# Patient Record
Sex: Female | Born: 1960 | Race: Black or African American | Hispanic: No | Smoking: Never smoker
Health system: Southern US, Community
[De-identification: ages and names within clinical notes are randomized; demographics above are authoritative.]

## PROBLEM LIST (undated history)

## (undated) DIAGNOSIS — I10 Essential (primary) hypertension: Secondary | ICD-10-CM

## (undated) DIAGNOSIS — E119 Type 2 diabetes mellitus without complications: Secondary | ICD-10-CM

---

## 1999-05-31 ENCOUNTER — Ambulatory Visit (HOSPITAL_COMMUNITY): Admission: RE | Admit: 1999-05-31 | Discharge: 1999-05-31 | Payer: Self-pay | Admitting: Family Medicine

## 1999-05-31 ENCOUNTER — Encounter: Payer: Self-pay | Admitting: Family Medicine

## 1999-07-26 ENCOUNTER — Other Ambulatory Visit: Admission: RE | Admit: 1999-07-26 | Discharge: 1999-07-26 | Payer: Self-pay | Admitting: Obstetrics and Gynecology

## 2000-02-24 ENCOUNTER — Encounter: Admission: RE | Admit: 2000-02-24 | Discharge: 2000-02-24 | Payer: Self-pay | Admitting: Obstetrics and Gynecology

## 2000-02-24 ENCOUNTER — Encounter: Payer: Self-pay | Admitting: Obstetrics and Gynecology

## 2001-03-22 ENCOUNTER — Encounter: Payer: Self-pay | Admitting: Obstetrics and Gynecology

## 2001-03-22 ENCOUNTER — Encounter: Admission: RE | Admit: 2001-03-22 | Discharge: 2001-03-22 | Payer: Self-pay | Admitting: Physical Therapy

## 2002-09-07 ENCOUNTER — Other Ambulatory Visit: Admission: RE | Admit: 2002-09-07 | Discharge: 2002-09-07 | Payer: Self-pay | Admitting: Obstetrics and Gynecology

## 2003-06-06 ENCOUNTER — Emergency Department (HOSPITAL_COMMUNITY): Admission: EM | Admit: 2003-06-06 | Discharge: 2003-06-06 | Payer: Self-pay | Admitting: Emergency Medicine

## 2003-09-29 ENCOUNTER — Other Ambulatory Visit: Admission: RE | Admit: 2003-09-29 | Discharge: 2003-09-29 | Payer: Self-pay | Admitting: Obstetrics and Gynecology

## 2004-03-19 ENCOUNTER — Emergency Department (HOSPITAL_COMMUNITY): Admission: EM | Admit: 2004-03-19 | Discharge: 2004-03-19 | Payer: Self-pay | Admitting: Family Medicine

## 2004-07-30 ENCOUNTER — Encounter: Admission: RE | Admit: 2004-07-30 | Discharge: 2004-09-17 | Payer: Self-pay | Admitting: Family Medicine

## 2004-11-08 ENCOUNTER — Encounter: Admission: RE | Admit: 2004-11-08 | Discharge: 2005-02-06 | Payer: Self-pay | Admitting: Family Medicine

## 2005-01-16 ENCOUNTER — Other Ambulatory Visit: Admission: RE | Admit: 2005-01-16 | Discharge: 2005-01-16 | Payer: Self-pay | Admitting: Obstetrics and Gynecology

## 2009-10-22 ENCOUNTER — Encounter: Admission: RE | Admit: 2009-10-22 | Discharge: 2009-10-22 | Payer: Self-pay | Admitting: Obstetrics and Gynecology

## 2016-02-26 DIAGNOSIS — D649 Anemia, unspecified: Secondary | ICD-10-CM | POA: Diagnosis not present

## 2016-02-26 DIAGNOSIS — I10 Essential (primary) hypertension: Secondary | ICD-10-CM | POA: Diagnosis not present

## 2016-02-26 DIAGNOSIS — E1100 Type 2 diabetes mellitus with hyperosmolarity without nonketotic hyperglycemic-hyperosmolar coma (NKHHC): Secondary | ICD-10-CM | POA: Diagnosis not present

## 2016-02-26 DIAGNOSIS — L630 Alopecia (capitis) totalis: Secondary | ICD-10-CM | POA: Diagnosis not present

## 2016-02-26 DIAGNOSIS — D644 Congenital dyserythropoietic anemia: Secondary | ICD-10-CM | POA: Diagnosis not present

## 2017-01-20 DIAGNOSIS — S43401A Unspecified sprain of right shoulder joint, initial encounter: Secondary | ICD-10-CM | POA: Diagnosis not present

## 2017-01-22 DIAGNOSIS — I10 Essential (primary) hypertension: Secondary | ICD-10-CM | POA: Diagnosis not present

## 2017-01-22 DIAGNOSIS — E1122 Type 2 diabetes mellitus with diabetic chronic kidney disease: Secondary | ICD-10-CM | POA: Diagnosis not present

## 2017-01-22 DIAGNOSIS — L630 Alopecia (capitis) totalis: Secondary | ICD-10-CM | POA: Diagnosis not present

## 2017-02-13 DIAGNOSIS — M24811 Other specific joint derangements of right shoulder, not elsewhere classified: Secondary | ICD-10-CM | POA: Diagnosis not present

## 2017-02-13 DIAGNOSIS — M542 Cervicalgia: Secondary | ICD-10-CM | POA: Diagnosis not present

## 2017-02-28 DIAGNOSIS — E1122 Type 2 diabetes mellitus with diabetic chronic kidney disease: Secondary | ICD-10-CM | POA: Diagnosis not present

## 2017-02-28 DIAGNOSIS — I10 Essential (primary) hypertension: Secondary | ICD-10-CM | POA: Diagnosis not present

## 2017-02-28 DIAGNOSIS — L630 Alopecia (capitis) totalis: Secondary | ICD-10-CM | POA: Diagnosis not present

## 2017-03-06 ENCOUNTER — Encounter: Payer: Self-pay | Admitting: Emergency Medicine

## 2017-03-06 ENCOUNTER — Emergency Department (HOSPITAL_COMMUNITY)
Admission: EM | Admit: 2017-03-06 | Discharge: 2017-03-06 | Disposition: A | Discharge status: Home / Self Care | Payer: BLUE CROSS/BLUE SHIELD | Attending: Emergency Medicine | Admitting: Emergency Medicine

## 2017-03-06 ENCOUNTER — Emergency Department (HOSPITAL_COMMUNITY): Payer: BLUE CROSS/BLUE SHIELD

## 2017-03-06 DIAGNOSIS — M25512 Pain in left shoulder: Secondary | ICD-10-CM | POA: Diagnosis not present

## 2017-03-06 DIAGNOSIS — Y939 Activity, unspecified: Secondary | ICD-10-CM | POA: Insufficient documentation

## 2017-03-06 DIAGNOSIS — Z79899 Other long term (current) drug therapy: Secondary | ICD-10-CM | POA: Insufficient documentation

## 2017-03-06 DIAGNOSIS — E119 Type 2 diabetes mellitus without complications: Secondary | ICD-10-CM | POA: Diagnosis not present

## 2017-03-06 DIAGNOSIS — S199XXA Unspecified injury of neck, initial encounter: Secondary | ICD-10-CM | POA: Diagnosis present

## 2017-03-06 DIAGNOSIS — M546 Pain in thoracic spine: Secondary | ICD-10-CM | POA: Diagnosis not present

## 2017-03-06 DIAGNOSIS — S40012A Contusion of left shoulder, initial encounter: Secondary | ICD-10-CM | POA: Insufficient documentation

## 2017-03-06 DIAGNOSIS — M545 Low back pain: Secondary | ICD-10-CM | POA: Diagnosis not present

## 2017-03-06 DIAGNOSIS — M542 Cervicalgia: Secondary | ICD-10-CM | POA: Diagnosis not present

## 2017-03-06 DIAGNOSIS — Y999 Unspecified external cause status: Secondary | ICD-10-CM | POA: Insufficient documentation

## 2017-03-06 DIAGNOSIS — S161XXA Strain of muscle, fascia and tendon at neck level, initial encounter: Secondary | ICD-10-CM | POA: Insufficient documentation

## 2017-03-06 DIAGNOSIS — S299XXA Unspecified injury of thorax, initial encounter: Secondary | ICD-10-CM | POA: Diagnosis not present

## 2017-03-06 DIAGNOSIS — S29012A Strain of muscle and tendon of back wall of thorax, initial encounter: Secondary | ICD-10-CM | POA: Diagnosis not present

## 2017-03-06 DIAGNOSIS — M25552 Pain in left hip: Secondary | ICD-10-CM | POA: Diagnosis not present

## 2017-03-06 DIAGNOSIS — S39012A Strain of muscle, fascia and tendon of lower back, initial encounter: Secondary | ICD-10-CM

## 2017-03-06 DIAGNOSIS — S4992XA Unspecified injury of left shoulder and upper arm, initial encounter: Secondary | ICD-10-CM | POA: Diagnosis not present

## 2017-03-06 DIAGNOSIS — T148XXA Other injury of unspecified body region, initial encounter: Secondary | ICD-10-CM | POA: Diagnosis not present

## 2017-03-06 DIAGNOSIS — S79912A Unspecified injury of left hip, initial encounter: Secondary | ICD-10-CM | POA: Diagnosis not present

## 2017-03-06 DIAGNOSIS — I10 Essential (primary) hypertension: Secondary | ICD-10-CM | POA: Insufficient documentation

## 2017-03-06 DIAGNOSIS — Z794 Long term (current) use of insulin: Secondary | ICD-10-CM | POA: Diagnosis not present

## 2017-03-06 DIAGNOSIS — R079 Chest pain, unspecified: Secondary | ICD-10-CM | POA: Diagnosis not present

## 2017-03-06 DIAGNOSIS — Y92410 Unspecified street and highway as the place of occurrence of the external cause: Secondary | ICD-10-CM | POA: Insufficient documentation

## 2017-03-06 HISTORY — DX: Essential (primary) hypertension: I10

## 2017-03-06 HISTORY — DX: Type 2 diabetes mellitus without complications: E11.9

## 2017-03-06 MED ORDER — ONDANSETRON 8 MG PO TBDP
8.0000 mg | ORAL_TABLET | Freq: Once | ORAL | Status: AC
  Administered 2017-03-06: 8 mg via ORAL
  Filled 2017-03-06: qty 1

## 2017-03-06 MED ORDER — OXYCODONE-ACETAMINOPHEN 5-325 MG PO TABS
2.0000 | ORAL_TABLET | Freq: Once | ORAL | Status: AC
  Administered 2017-03-06: 2 via ORAL
  Filled 2017-03-06: qty 2

## 2017-03-06 NOTE — ED Notes (Signed)
Pt at X-ray

## 2017-03-06 NOTE — ED Notes (Signed)
Pt reports nausea.  Dr. Rosalia Hammersay made aware.  zofran ordered.
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
## 2017-03-09 DIAGNOSIS — M545 Low back pain: Secondary | ICD-10-CM | POA: Diagnosis not present

## 2017-03-09 DIAGNOSIS — G44319 Acute post-traumatic headache, not intractable: Secondary | ICD-10-CM | POA: Diagnosis not present

## 2017-03-09 DIAGNOSIS — M542 Cervicalgia: Secondary | ICD-10-CM | POA: Diagnosis not present

## 2017-03-18 DIAGNOSIS — M2560 Stiffness of unspecified joint, not elsewhere classified: Secondary | ICD-10-CM | POA: Diagnosis not present

## 2017-03-18 DIAGNOSIS — M545 Low back pain: Secondary | ICD-10-CM | POA: Diagnosis not present

## 2017-03-18 DIAGNOSIS — M62830 Muscle spasm of back: Secondary | ICD-10-CM | POA: Diagnosis not present

## 2017-03-18 DIAGNOSIS — M25512 Pain in left shoulder: Secondary | ICD-10-CM | POA: Diagnosis not present

## 2017-03-23 DIAGNOSIS — M545 Low back pain: Secondary | ICD-10-CM | POA: Diagnosis not present

## 2017-03-23 DIAGNOSIS — M542 Cervicalgia: Secondary | ICD-10-CM | POA: Diagnosis not present

## 2017-03-23 DIAGNOSIS — M62830 Muscle spasm of back: Secondary | ICD-10-CM | POA: Diagnosis not present

## 2017-03-23 DIAGNOSIS — M25512 Pain in left shoulder: Secondary | ICD-10-CM | POA: Diagnosis not present

## 2017-03-23 DIAGNOSIS — M2560 Stiffness of unspecified joint, not elsewhere classified: Secondary | ICD-10-CM | POA: Diagnosis not present

## 2017-03-25 DIAGNOSIS — M545 Low back pain: Secondary | ICD-10-CM | POA: Diagnosis not present

## 2017-03-25 DIAGNOSIS — M2560 Stiffness of unspecified joint, not elsewhere classified: Secondary | ICD-10-CM | POA: Diagnosis not present

## 2017-03-25 DIAGNOSIS — M25512 Pain in left shoulder: Secondary | ICD-10-CM | POA: Diagnosis not present

## 2017-03-25 DIAGNOSIS — M62830 Muscle spasm of back: Secondary | ICD-10-CM | POA: Diagnosis not present

## 2017-03-30 DIAGNOSIS — M2560 Stiffness of unspecified joint, not elsewhere classified: Secondary | ICD-10-CM | POA: Diagnosis not present

## 2017-03-30 DIAGNOSIS — M545 Low back pain: Secondary | ICD-10-CM | POA: Diagnosis not present

## 2017-03-30 DIAGNOSIS — M62830 Muscle spasm of back: Secondary | ICD-10-CM | POA: Diagnosis not present

## 2017-03-30 DIAGNOSIS — M25512 Pain in left shoulder: Secondary | ICD-10-CM | POA: Diagnosis not present

## 2017-04-01 DIAGNOSIS — M62830 Muscle spasm of back: Secondary | ICD-10-CM | POA: Diagnosis not present

## 2017-04-01 DIAGNOSIS — M25512 Pain in left shoulder: Secondary | ICD-10-CM | POA: Diagnosis not present

## 2017-04-01 DIAGNOSIS — M545 Low back pain: Secondary | ICD-10-CM | POA: Diagnosis not present

## 2017-04-01 DIAGNOSIS — M2560 Stiffness of unspecified joint, not elsewhere classified: Secondary | ICD-10-CM | POA: Diagnosis not present

## 2017-04-14 DIAGNOSIS — Z1231 Encounter for screening mammogram for malignant neoplasm of breast: Secondary | ICD-10-CM | POA: Diagnosis not present

## 2017-04-14 DIAGNOSIS — Z6828 Body mass index (BMI) 28.0-28.9, adult: Secondary | ICD-10-CM | POA: Diagnosis not present

## 2017-04-14 DIAGNOSIS — Z01419 Encounter for gynecological examination (general) (routine) without abnormal findings: Secondary | ICD-10-CM | POA: Diagnosis not present

## 2017-04-15 DIAGNOSIS — M2560 Stiffness of unspecified joint, not elsewhere classified: Secondary | ICD-10-CM | POA: Diagnosis not present

## 2017-04-15 DIAGNOSIS — M62830 Muscle spasm of back: Secondary | ICD-10-CM | POA: Diagnosis not present

## 2017-04-15 DIAGNOSIS — M25512 Pain in left shoulder: Secondary | ICD-10-CM | POA: Diagnosis not present

## 2017-04-15 DIAGNOSIS — M542 Cervicalgia: Secondary | ICD-10-CM | POA: Diagnosis not present

## 2017-04-15 DIAGNOSIS — M545 Low back pain: Secondary | ICD-10-CM | POA: Diagnosis not present

## 2017-04-17 DIAGNOSIS — M545 Low back pain: Secondary | ICD-10-CM | POA: Diagnosis not present

## 2017-04-17 DIAGNOSIS — M25512 Pain in left shoulder: Secondary | ICD-10-CM | POA: Diagnosis not present

## 2017-04-17 DIAGNOSIS — M2560 Stiffness of unspecified joint, not elsewhere classified: Secondary | ICD-10-CM | POA: Diagnosis not present

## 2017-04-17 DIAGNOSIS — M62830 Muscle spasm of back: Secondary | ICD-10-CM | POA: Diagnosis not present

## 2017-04-21 DIAGNOSIS — D0512 Intraductal carcinoma in situ of left breast: Secondary | ICD-10-CM | POA: Diagnosis not present

## 2017-04-27 DIAGNOSIS — Z170 Estrogen receptor positive status [ER+]: Secondary | ICD-10-CM | POA: Diagnosis not present

## 2017-04-27 DIAGNOSIS — M545 Low back pain: Secondary | ICD-10-CM | POA: Diagnosis not present

## 2017-04-27 DIAGNOSIS — C50912 Malignant neoplasm of unspecified site of left female breast: Secondary | ICD-10-CM | POA: Diagnosis not present

## 2017-04-27 DIAGNOSIS — M25512 Pain in left shoulder: Secondary | ICD-10-CM | POA: Diagnosis not present

## 2017-04-27 DIAGNOSIS — M62830 Muscle spasm of back: Secondary | ICD-10-CM | POA: Diagnosis not present

## 2017-04-27 DIAGNOSIS — M2560 Stiffness of unspecified joint, not elsewhere classified: Secondary | ICD-10-CM | POA: Diagnosis not present

## 2017-04-29 DIAGNOSIS — M25512 Pain in left shoulder: Secondary | ICD-10-CM | POA: Diagnosis not present

## 2017-04-29 DIAGNOSIS — M545 Low back pain: Secondary | ICD-10-CM | POA: Diagnosis not present

## 2017-04-29 DIAGNOSIS — M62830 Muscle spasm of back: Secondary | ICD-10-CM | POA: Diagnosis not present

## 2017-04-29 DIAGNOSIS — M2560 Stiffness of unspecified joint, not elsewhere classified: Secondary | ICD-10-CM | POA: Diagnosis not present

## 2017-04-30 DIAGNOSIS — C50912 Malignant neoplasm of unspecified site of left female breast: Secondary | ICD-10-CM | POA: Diagnosis not present

## 2017-04-30 DIAGNOSIS — R42 Dizziness and giddiness: Secondary | ICD-10-CM | POA: Diagnosis not present

## 2017-05-04 DIAGNOSIS — M545 Low back pain: Secondary | ICD-10-CM | POA: Diagnosis not present

## 2017-05-04 DIAGNOSIS — M2560 Stiffness of unspecified joint, not elsewhere classified: Secondary | ICD-10-CM | POA: Diagnosis not present

## 2017-05-04 DIAGNOSIS — M25512 Pain in left shoulder: Secondary | ICD-10-CM | POA: Diagnosis not present

## 2017-05-04 DIAGNOSIS — M62830 Muscle spasm of back: Secondary | ICD-10-CM | POA: Diagnosis not present

## 2017-05-06 DIAGNOSIS — Z170 Estrogen receptor positive status [ER+]: Secondary | ICD-10-CM | POA: Diagnosis not present

## 2017-05-06 DIAGNOSIS — Z803 Family history of malignant neoplasm of breast: Secondary | ICD-10-CM | POA: Diagnosis not present

## 2017-05-06 DIAGNOSIS — M2560 Stiffness of unspecified joint, not elsewhere classified: Secondary | ICD-10-CM | POA: Diagnosis not present

## 2017-05-06 DIAGNOSIS — M545 Low back pain: Secondary | ICD-10-CM | POA: Diagnosis not present

## 2017-05-06 DIAGNOSIS — C50912 Malignant neoplasm of unspecified site of left female breast: Secondary | ICD-10-CM | POA: Diagnosis not present

## 2017-05-06 DIAGNOSIS — M25512 Pain in left shoulder: Secondary | ICD-10-CM | POA: Diagnosis not present

## 2017-05-06 DIAGNOSIS — M62830 Muscle spasm of back: Secondary | ICD-10-CM | POA: Diagnosis not present

## 2017-05-06 DIAGNOSIS — D0512 Intraductal carcinoma in situ of left breast: Secondary | ICD-10-CM | POA: Diagnosis not present

## 2017-05-06 DIAGNOSIS — C50212 Malignant neoplasm of upper-inner quadrant of left female breast: Secondary | ICD-10-CM | POA: Diagnosis not present

## 2017-05-07 DIAGNOSIS — L630 Alopecia (capitis) totalis: Secondary | ICD-10-CM | POA: Diagnosis not present

## 2017-05-07 DIAGNOSIS — F4310 Post-traumatic stress disorder, unspecified: Secondary | ICD-10-CM | POA: Diagnosis not present

## 2017-05-07 DIAGNOSIS — I10 Essential (primary) hypertension: Secondary | ICD-10-CM | POA: Diagnosis not present

## 2017-05-07 DIAGNOSIS — E1122 Type 2 diabetes mellitus with diabetic chronic kidney disease: Secondary | ICD-10-CM | POA: Diagnosis not present

## 2017-05-11 DIAGNOSIS — M62830 Muscle spasm of back: Secondary | ICD-10-CM | POA: Diagnosis not present

## 2017-05-11 DIAGNOSIS — M2560 Stiffness of unspecified joint, not elsewhere classified: Secondary | ICD-10-CM | POA: Diagnosis not present

## 2017-05-11 DIAGNOSIS — M545 Low back pain: Secondary | ICD-10-CM | POA: Diagnosis not present

## 2017-05-11 DIAGNOSIS — M25512 Pain in left shoulder: Secondary | ICD-10-CM | POA: Diagnosis not present

## 2017-05-11 DIAGNOSIS — C50912 Malignant neoplasm of unspecified site of left female breast: Secondary | ICD-10-CM | POA: Diagnosis not present

## 2017-05-11 DIAGNOSIS — Z170 Estrogen receptor positive status [ER+]: Secondary | ICD-10-CM | POA: Diagnosis not present

## 2017-05-14 DIAGNOSIS — Z853 Personal history of malignant neoplasm of breast: Secondary | ICD-10-CM | POA: Diagnosis not present

## 2017-05-14 DIAGNOSIS — Z9882 Breast implant status: Secondary | ICD-10-CM | POA: Diagnosis not present

## 2017-05-18 DIAGNOSIS — M545 Low back pain: Secondary | ICD-10-CM | POA: Diagnosis not present

## 2017-05-18 DIAGNOSIS — M2560 Stiffness of unspecified joint, not elsewhere classified: Secondary | ICD-10-CM | POA: Diagnosis not present

## 2017-05-18 DIAGNOSIS — M25512 Pain in left shoulder: Secondary | ICD-10-CM | POA: Diagnosis not present

## 2017-05-18 DIAGNOSIS — M62830 Muscle spasm of back: Secondary | ICD-10-CM | POA: Diagnosis not present

## 2017-06-02 DIAGNOSIS — C50819 Malignant neoplasm of overlapping sites of unspecified female breast: Secondary | ICD-10-CM | POA: Diagnosis not present

## 2017-06-02 DIAGNOSIS — Z170 Estrogen receptor positive status [ER+]: Secondary | ICD-10-CM | POA: Diagnosis not present

## 2017-06-12 DIAGNOSIS — E1122 Type 2 diabetes mellitus with diabetic chronic kidney disease: Secondary | ICD-10-CM | POA: Diagnosis not present

## 2017-06-12 DIAGNOSIS — I10 Essential (primary) hypertension: Secondary | ICD-10-CM | POA: Diagnosis not present

## 2017-06-12 DIAGNOSIS — L630 Alopecia (capitis) totalis: Secondary | ICD-10-CM | POA: Diagnosis not present

## 2017-07-09 DIAGNOSIS — R2240 Localized swelling, mass and lump, unspecified lower limb: Secondary | ICD-10-CM | POA: Diagnosis not present

## 2017-07-29 ENCOUNTER — Ambulatory Visit (INDEPENDENT_AMBULATORY_CARE_PROVIDER_SITE_OTHER): Payer: BLUE CROSS/BLUE SHIELD | Admitting: Surgery

## 2017-07-29 ENCOUNTER — Encounter (INDEPENDENT_AMBULATORY_CARE_PROVIDER_SITE_OTHER): Payer: Self-pay | Admitting: Surgery

## 2017-07-29 DIAGNOSIS — M5442 Lumbago with sciatica, left side: Secondary | ICD-10-CM

## 2017-07-29 DIAGNOSIS — M542 Cervicalgia: Secondary | ICD-10-CM | POA: Diagnosis not present

## 2017-07-29 DIAGNOSIS — M5441 Lumbago with sciatica, right side: Secondary | ICD-10-CM
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
## 2017-07-30 DIAGNOSIS — I10 Essential (primary) hypertension: Secondary | ICD-10-CM | POA: Diagnosis not present

## 2017-07-30 DIAGNOSIS — E118 Type 2 diabetes mellitus with unspecified complications: Secondary | ICD-10-CM | POA: Diagnosis not present

## 2017-08-05 ENCOUNTER — Emergency Department (HOSPITAL_COMMUNITY): Payer: BLUE CROSS/BLUE SHIELD

## 2017-08-05 ENCOUNTER — Emergency Department (HOSPITAL_COMMUNITY)
Admission: EM | Admit: 2017-08-05 | Discharge: 2017-08-05 | Disposition: A | Discharge status: Home / Self Care | Payer: BLUE CROSS/BLUE SHIELD | Attending: Emergency Medicine | Admitting: Emergency Medicine

## 2017-08-05 ENCOUNTER — Encounter (HOSPITAL_COMMUNITY): Payer: Self-pay | Admitting: Nurse Practitioner

## 2017-08-05 DIAGNOSIS — R197 Diarrhea, unspecified: Secondary | ICD-10-CM

## 2017-08-05 DIAGNOSIS — R404 Transient alteration of awareness: Secondary | ICD-10-CM | POA: Diagnosis not present

## 2017-08-05 DIAGNOSIS — I10 Essential (primary) hypertension: Secondary | ICD-10-CM | POA: Diagnosis not present

## 2017-08-05 DIAGNOSIS — E119 Type 2 diabetes mellitus without complications: Secondary | ICD-10-CM | POA: Diagnosis not present

## 2017-08-05 DIAGNOSIS — R918 Other nonspecific abnormal finding of lung field: Secondary | ICD-10-CM | POA: Diagnosis not present

## 2017-08-05 DIAGNOSIS — Z794 Long term (current) use of insulin: Secondary | ICD-10-CM | POA: Insufficient documentation

## 2017-08-05 DIAGNOSIS — Z79899 Other long term (current) drug therapy: Secondary | ICD-10-CM | POA: Insufficient documentation

## 2017-08-05 DIAGNOSIS — R55 Syncope and collapse: Secondary | ICD-10-CM | POA: Insufficient documentation

## 2017-08-05 DIAGNOSIS — N3000 Acute cystitis without hematuria: Secondary | ICD-10-CM | POA: Insufficient documentation

## 2017-08-05 LAB — I-STAT CHEM 8, ED
BUN: 26 mg/dL — ABNORMAL HIGH (ref 6–20)
Calcium, Ion: 1.24 mmol/L (ref 1.15–1.40)
Chloride: 101 mmol/L (ref 101–111)
Creatinine, Ser: 1 mg/dL (ref 0.44–1.00)
Glucose, Bld: 193 mg/dL — ABNORMAL HIGH (ref 65–99)
HCT: 43 % (ref 36.0–46.0)
Hemoglobin: 14.6 g/dL (ref 12.0–15.0)
Potassium: 4.1 mmol/L (ref 3.5–5.1)
Sodium: 136 mmol/L (ref 135–145)
TCO2: 26 mmol/L (ref 22–32)

## 2017-08-05 LAB — URINALYSIS, ROUTINE W REFLEX MICROSCOPIC
Bilirubin Urine: NEGATIVE
Glucose, UA: >=500 mg/dL — AB
Ketones, ur: NEGATIVE mg/dL
Nitrite: POSITIVE — AB
Protein, ur: NEGATIVE mg/dL
Specific Gravity, Urine: 1.024 (ref 1.005–1.030)
pH: 5 (ref 5.0–8.0)

## 2017-08-05 LAB — CBC WITH DIFFERENTIAL/PLATELET
Basophils Absolute: 0 10*3/uL (ref 0.0–0.1)
Basophils Relative: 0 %
Eosinophils Absolute: 0.1 10*3/uL (ref 0.0–0.7)
Eosinophils Relative: 2 %
HCT: 41.3 % (ref 36.0–46.0)
Hemoglobin: 14.1 g/dL (ref 12.0–15.0)
Lymphocytes Relative: 29 %
Lymphs Abs: 1.5 10*3/uL (ref 0.7–4.0)
MCH: 26.8 pg (ref 26.0–34.0)
MCHC: 34.1 g/dL (ref 30.0–36.0)
MCV: 78.5 fL (ref 78.0–100.0)
Monocytes Absolute: 0.4 10*3/uL (ref 0.1–1.0)
Monocytes Relative: 7 %
Neutro Abs: 3.3 10*3/uL (ref 1.7–7.7)
Neutrophils Relative %: 62 %
Platelets: 241 10*3/uL (ref 150–400)
RBC: 5.26 MIL/uL — ABNORMAL HIGH (ref 3.87–5.11)
RDW: 13.5 % (ref 11.5–15.5)
WBC: 5.3 10*3/uL (ref 4.0–10.5)

## 2017-08-05 LAB — CBG MONITORING, ED: Glucose-Capillary: 202 mg/dL — ABNORMAL HIGH (ref 65–99)

## 2017-08-05 LAB — I-STAT TROPONIN, ED: Troponin i, poc: 0 ng/mL (ref 0.00–0.08)

## 2017-08-05 MED ORDER — CEPHALEXIN 500 MG PO CAPS: 500.0000 mg | ORAL_CAPSULE | Freq: Four times a day (QID) | ORAL | 0 refills | Status: DC

## 2017-08-05 MED ORDER — DEXTROSE 5 % IV SOLN
1.0000 g | Freq: Once | INTRAVENOUS | Status: AC
  Administered 2017-08-05: 1 g via INTRAVENOUS
  Filled 2017-08-05: qty 10

## 2017-08-05 MED ORDER — SODIUM CHLORIDE 0.9 % IV BOLUS (SEPSIS)
500.0000 mL | Freq: Once | INTRAVENOUS | Status: AC
  Administered 2017-08-05: 500 mL via INTRAVENOUS

## 2017-08-05 NOTE — ED Notes (Signed)
Unable to collect labs at this time patient is using the restroom. 

## 2017-08-05 NOTE — ED Triage Notes (Signed)
Pt is brought in by EMS, reportedly while at work she had an episode of diaphoresis, visual disturbance (she saw spots) and passed out. She has known PMHx of DM and her spot cbg check by EMS was 192. Had a recent HTN medication change where her amlodipine was decreased by half. Denies chest pain or shortness of breath at this time.

## 2017-08-05 NOTE — ED Provider Notes (Signed)
[facility]-EMERGENCY DEPT Provider Note   CSN: [id number] Arrival date & time: 08/05/17  0207     History   Chief Complaint Chief Complaint  Patient presents with  . Passed Out    HPI Whitney Mitchell is a 56 y.o. female.  The history is provided by the patient.  Loss of Consciousness   This is a new problem. The current episode started less than 1 hour ago. The problem occurs rarely. The problem has been resolved. She lost consciousness for a period of less than one minute. Associated with: feeling overheated and diarrhea. Associated symptoms include light-headedness and nausea. Pertinent negatives include abdominal pain, back pain, bladder incontinence, bowel incontinence, chest pain, clumsiness, confusion, congestion, diaphoresis, dizziness, fever, focal sensory loss, focal weakness, headaches, malaise/fatigue, palpitations, seizures, slurred speech, vertigo, visual change, vomiting and weakness. She has tried nothing for the symptoms. The treatment provided significant relief. Her past medical history is significant for DM. Her past medical history does not include CVA.    Past Medical History:  Diagnosis Date  . Diabetes mellitus without complication (HCC)   . Hypertension     There are no active problems to display for this patient.   History reviewed. No pertinent surgical history.  OB History    No data available       Home Medications    Prior to Admission medications   Medication Sig Start Date End Date Taking? Authorizing Provider  empagliflozin (JARDIANCE) 25 MG TABS tablet Take 25 mg by mouth daily.    [provider]  Insulin Glargine (TOUJEO SOLOSTAR [place]) Inject 5 Units into the skin.    [provider]  Ketotifen Fumarate (ALLERGY EYE DROPS OP) Place 1 drop into both eyes daily as needed (allergy).    [provider]  Multiple Vitamin (MULTIVITAMIN WITH MINERALS) TABS tablet Take 1 tablet by mouth daily.     [provider]  nystatin-triamcinolone (MYCOLOG II) cream Apply 1 application topically 2 (two) times a week. Applies 2 times a week as needed for ichiness 12/19/16   [provider]  Semaglutide (OZEMPIC) 0.25 or 0.5 MG/DOSE SOPN Inject 0.25 Units into the skin once a week.    [provider]    Family History History reviewed. No pertinent family history.  Social History Social History  Substance Use Topics  . Smoking status: Never Smoker  . Smokeless tobacco: Never Used  . Alcohol use No     Allergies   Patient has no known allergies.   Review of Systems Review of Systems  Constitutional: Negative for diaphoresis, fever and malaise/fatigue.  HENT: Negative for congestion.   Respiratory: Negative for shortness of breath.   Cardiovascular: Positive for syncope. Negative for chest pain, palpitations and leg swelling.  Gastrointestinal: Positive for nausea. Negative for abdominal pain, bowel incontinence and vomiting.  Genitourinary: Negative for bladder incontinence.  Musculoskeletal: Negative for back pain.  Neurological: Positive for light-headedness. Negative for dizziness, vertigo, tremors, focal weakness, seizures, speech difficulty, weakness, numbness and headaches.  Psychiatric/Behavioral: Negative for confusion.  All other systems reviewed and are negative.    Physical Exam Updated Vital Signs BP 128/86 (BP Location: Right Arm)   Pulse 77   Temp 98 F (36.7 C) (Oral)   Resp 15   SpO2 100%   Physical Exam  Constitutional: She is oriented to person, place, and time. She appears well-developed and well-nourished. No distress.  HENT:  Head: Normocephalic and atraumatic.  Mouth/Throat: No oropharyngeal exudate.  Eyes: Pupils are equal, round, and reactive to light. Conjunctivae are normal.  Neck: Normal range of motion. Neck supple. No JVD present.  Cardiovascular: Normal rate, regular rhythm, normal heart sounds and intact distal  pulses.   Pulmonary/Chest: Effort normal and breath sounds normal. No stridor. No respiratory distress. She has no wheezes. She has no rales.  Abdominal: Soft. Bowel sounds are normal. She exhibits no mass. There is no tenderness. There is no rebound and no guarding.  Musculoskeletal: Normal range of motion.  Lymphadenopathy:    She has no cervical adenopathy.  Neurological: She is alert and oriented to person, place, and time. No cranial nerve deficit.  Skin: Skin is warm and dry. Capillary refill takes less than 2 seconds.  Psychiatric: She has a normal mood and affect.     ED Treatments / Results   Vitals:   08/05/17 0216  BP: 128/86  Pulse: 77  Resp: 15  Temp: 98 F (36.7 C)  SpO2: 100%    Labs (all labs ordered are listed, but only abnormal results are displayed)  Results for orders placed or performed during the hospital encounter of 08/05/17  CBC with Differential/Platelet  Result Value Ref Range   WBC 5.3 4.0 - 10.5 K/uL   RBC 5.26 (H) 3.87 - 5.11 MIL/uL   Hemoglobin 14.1 12.0 - 15.0 g/dL   HCT 13.0 86.5 - 78.4 %   MCV 78.5 78.0 - 100.0 fL   MCH 26.8 26.0 - 34.0 pg   MCHC 34.1 30.0 - 36.0 g/dL   RDW 69.6 29.5 - 28.4 %   Platelets 241 150 - 400 K/uL   Neutrophils Relative % 62 %   Neutro Abs 3.3 1.7 - 7.7 K/uL   Lymphocytes Relative 29 %   Lymphs Abs 1.5 0.7 - 4.0 K/uL   Monocytes Relative 7 %   Monocytes Absolute 0.4 0.1 - 1.0 K/uL   Eosinophils Relative 2 %   Eosinophils Absolute 0.1 0.0 - 0.7 K/uL   Basophils Relative 0 %   Basophils Absolute 0.0 0.0 - 0.1 K/uL  Urinalysis, Routine w reflex microscopic  Result Value Ref Range   Color, Urine YELLOW YELLOW   APPearance CLOUDY (A) CLEAR   Specific Gravity, Urine 1.024 1.005 - 1.030   pH 5.0 5.0 - 8.0   Glucose, UA >=500 (A) NEGATIVE mg/dL   Hgb urine dipstick SMALL (A) NEGATIVE   Bilirubin Urine NEGATIVE NEGATIVE   Ketones, ur NEGATIVE NEGATIVE mg/dL   Protein, ur NEGATIVE NEGATIVE mg/dL   Nitrite  POSITIVE (A) NEGATIVE   Leukocytes, UA LARGE (A) NEGATIVE   RBC / HPF 6-30 0 - 5 RBC/hpf   WBC, UA TOO NUMEROUS TO COUNT 0 - 5 WBC/hpf   Bacteria, UA MANY (A) NONE SEEN   Squamous Epithelial / LPF 6-30 (A) NONE SEEN   Mucus PRESENT   CBG monitoring, ED  Result Value Ref Range   Glucose-Capillary 202 (H) 65 - 99 mg/dL   Comment 1 Notify RN    Comment 2 Document in Chart   I-stat chem 8, ed  Result Value Ref Range   Sodium 136 135 - 145 mmol/L   Potassium 4.1 3.5 - 5.1 mmol/L   Chloride 101 101 - 111 mmol/L   BUN 26 (H) 6 - 20 mg/dL   Creatinine, Ser 1.32 0.44 - 1.00 mg/dL   Glucose, Bld 440 (H) 65 - 99 mg/dL   Calcium, Ion 1.02 7.25 - 1.40 mmol/L   TCO2 26 22 - 32  mmol/L   Hemoglobin 14.6 12.0 - 15.0 g/dL   HCT 16.143.0 09.636.0 - 04.546.0 %  I-stat troponin, ED  Result Value Ref Range   Troponin i, poc 0.00 0.00 - 0.08 ng/mL   Comment 3           No results found.   EKG Interpretation  Date/Time:  Wednesday August 05 2017 02:11:16 EDT Ventricular Rate:  80 PR Interval:    QRS Duration: 86 QT Interval:  378 QTC Calculation: 436 R Axis:   76 Text Interpretation:  Sinus rhythm Confirmed by Nicanor AlconPalumbo, Haylee Mcanany ([id number]) on 08/05/2017 4:02:24 AM       Procedures Procedures (including critical care time)  Medications Ordered in ED Medications  sodium chloride 0.9 % bolus 500 mL (not administered)  cefTRIAXone (ROCEPHIN) 1 g in dextrose 5 % 50 mL IVPB (not administered)      Final Clinical Impressions(s) / ED Diagnoses  UTi and diarrhea:  Not orthostatic no ectopy on the monitor.  Follow up with your PMD.  All questions answered to the patient's satisfaction.    Strict return precautions given for  Shortness of breath, swelling or the lips or tongue, chest pain, dyspnea on exertion, new weakness or numbness changes in vision or speech,  Inability to tolerate liquids or food, changes in voice cough, altered mental status or any concerns. No signs of systemic illness or infection. The  patient is nontoxic-appearing on exam and vital signs are within normal limits.    I have reviewed the triage vital signs and the nursing notes. Pertinent labs &imaging results that were available during my care of the patient were reviewed by me and considered in my medical decision making (see chart for details).  After history, exam, and medical workup I feel the patient has been appropriately medically screened and is safe for discharge home. Pertinent diagnoses were discussed with the patient. Patient was given return precautions.     Kaid Seeberger, MD 08/05/17 [id number]

## 2017-08-05 NOTE — ED Notes (Signed)
Bed: [id number] Expected date:  Expected time:  Means of arrival:  Comments: 56yoF  Syncopal episode

## 2017-08-25 ENCOUNTER — Encounter (INDEPENDENT_AMBULATORY_CARE_PROVIDER_SITE_OTHER): Payer: Self-pay

## 2017-08-25 ENCOUNTER — Ambulatory Visit (INDEPENDENT_AMBULATORY_CARE_PROVIDER_SITE_OTHER): Payer: BLUE CROSS/BLUE SHIELD | Admitting: Orthopaedic Surgery

## 2017-09-01 ENCOUNTER — Other Ambulatory Visit (HOSPITAL_COMMUNITY)
Admission: RE | Admit: 2017-09-01 | Discharge: 2017-09-01 | Disposition: A | Discharge status: Home / Self Care | Payer: BLUE CROSS/BLUE SHIELD | Source: Ambulatory Visit | Attending: Family Medicine | Admitting: Family Medicine

## 2017-09-01 ENCOUNTER — Other Ambulatory Visit: Payer: Self-pay | Admitting: Family Medicine

## 2017-09-01 DIAGNOSIS — Z113 Encounter for screening for infections with a predominantly sexual mode of transmission: Secondary | ICD-10-CM | POA: Diagnosis not present

## 2017-09-01 DIAGNOSIS — H9193 Unspecified hearing loss, bilateral: Secondary | ICD-10-CM | POA: Diagnosis not present

## 2017-09-01 DIAGNOSIS — E1100 Type 2 diabetes mellitus with hyperosmolarity without nonketotic hyperglycemic-hyperosmolar coma (NKHHC): Secondary | ICD-10-CM | POA: Diagnosis not present

## 2017-09-01 DIAGNOSIS — N760 Acute vaginitis: Secondary | ICD-10-CM | POA: Insufficient documentation

## 2017-09-01 DIAGNOSIS — N390 Urinary tract infection, site not specified: Secondary | ICD-10-CM | POA: Diagnosis not present

## 2017-09-04 LAB — URINE CYTOLOGY ANCILLARY ONLY
Bacterial vaginitis: POSITIVE — AB
Candida vaginitis: NEGATIVE
Chlamydia: NEGATIVE
Neisseria Gonorrhea: NEGATIVE
Trichomonas: NEGATIVE

## 2017-10-20 HISTORY — PX: BRAIN SURGERY: SHX531

## 2017-11-11 DIAGNOSIS — R197 Diarrhea, unspecified: Secondary | ICD-10-CM | POA: Diagnosis not present

## 2017-11-11 DIAGNOSIS — I10 Essential (primary) hypertension: Secondary | ICD-10-CM | POA: Diagnosis not present

## 2017-11-11 DIAGNOSIS — J399 Disease of upper respiratory tract, unspecified: Secondary | ICD-10-CM | POA: Diagnosis not present

## 2017-11-11 DIAGNOSIS — E1122 Type 2 diabetes mellitus with diabetic chronic kidney disease: Secondary | ICD-10-CM | POA: Diagnosis not present

## 2017-11-23 DIAGNOSIS — R55 Syncope and collapse: Secondary | ICD-10-CM | POA: Diagnosis not present

## 2017-11-23 DIAGNOSIS — R531 Weakness: Secondary | ICD-10-CM | POA: Diagnosis not present

## 2017-11-23 DIAGNOSIS — S0990XA Unspecified injury of head, initial encounter: Secondary | ICD-10-CM | POA: Diagnosis not present

## 2017-11-23 DIAGNOSIS — N3000 Acute cystitis without hematuria: Secondary | ICD-10-CM | POA: Diagnosis not present

## 2017-11-23 DIAGNOSIS — M542 Cervicalgia: Secondary | ICD-10-CM | POA: Diagnosis not present

## 2017-11-23 DIAGNOSIS — R9431 Abnormal electrocardiogram [ECG] [EKG]: Secondary | ICD-10-CM | POA: Diagnosis not present

## 2017-11-23 DIAGNOSIS — R42 Dizziness and giddiness: Secondary | ICD-10-CM | POA: Diagnosis not present

## 2017-11-23 DIAGNOSIS — R202 Paresthesia of skin: Secondary | ICD-10-CM | POA: Diagnosis not present

## 2017-11-23 DIAGNOSIS — Z5181 Encounter for therapeutic drug level monitoring: Secondary | ICD-10-CM | POA: Diagnosis not present

## 2017-11-23 DIAGNOSIS — D352 Benign neoplasm of pituitary gland: Secondary | ICD-10-CM | POA: Diagnosis not present

## 2017-11-23 DIAGNOSIS — R51 Headache: Secondary | ICD-10-CM | POA: Diagnosis not present

## 2017-11-23 DIAGNOSIS — R339 Retention of urine, unspecified: Secondary | ICD-10-CM | POA: Diagnosis not present

## 2017-11-23 DIAGNOSIS — S3993XA Unspecified injury of pelvis, initial encounter: Secondary | ICD-10-CM | POA: Diagnosis not present

## 2017-11-23 DIAGNOSIS — N390 Urinary tract infection, site not specified: Secondary | ICD-10-CM | POA: Diagnosis not present

## 2017-11-23 DIAGNOSIS — R2681 Unsteadiness on feet: Secondary | ICD-10-CM | POA: Diagnosis not present

## 2017-11-23 DIAGNOSIS — W19XXXA Unspecified fall, initial encounter: Secondary | ICD-10-CM | POA: Diagnosis not present

## 2017-11-23 DIAGNOSIS — Y33XXXA Other specified events, undetermined intent, initial encounter: Secondary | ICD-10-CM | POA: Diagnosis not present

## 2017-11-23 DIAGNOSIS — E222 Syndrome of inappropriate secretion of antidiuretic hormone: Secondary | ICD-10-CM | POA: Diagnosis not present

## 2017-11-24 DIAGNOSIS — N390 Urinary tract infection, site not specified: Secondary | ICD-10-CM | POA: Diagnosis not present

## 2017-11-24 DIAGNOSIS — R55 Syncope and collapse: Secondary | ICD-10-CM | POA: Diagnosis not present

## 2017-11-25 DIAGNOSIS — D352 Benign neoplasm of pituitary gland: Secondary | ICD-10-CM | POA: Diagnosis not present

## 2017-12-05 DIAGNOSIS — E1122 Type 2 diabetes mellitus with diabetic chronic kidney disease: Secondary | ICD-10-CM | POA: Diagnosis not present

## 2017-12-09 DIAGNOSIS — R55 Syncope and collapse: Secondary | ICD-10-CM | POA: Diagnosis not present

## 2017-12-16 DIAGNOSIS — D352 Benign neoplasm of pituitary gland: Secondary | ICD-10-CM | POA: Diagnosis not present

## 2017-12-16 DIAGNOSIS — H3581 Retinal edema: Secondary | ICD-10-CM | POA: Diagnosis not present

## 2017-12-16 DIAGNOSIS — E871 Hypo-osmolality and hyponatremia: Secondary | ICD-10-CM | POA: Diagnosis not present

## 2017-12-29 DIAGNOSIS — J310 Chronic rhinitis: Secondary | ICD-10-CM | POA: Diagnosis not present

## 2017-12-29 DIAGNOSIS — R2681 Unsteadiness on feet: Secondary | ICD-10-CM | POA: Diagnosis not present

## 2017-12-29 DIAGNOSIS — E1165 Type 2 diabetes mellitus with hyperglycemia: Secondary | ICD-10-CM | POA: Diagnosis not present

## 2017-12-29 DIAGNOSIS — E118 Type 2 diabetes mellitus with unspecified complications: Secondary | ICD-10-CM | POA: Diagnosis not present

## 2017-12-29 DIAGNOSIS — I10 Essential (primary) hypertension: Secondary | ICD-10-CM | POA: Diagnosis not present

## 2017-12-29 DIAGNOSIS — D352 Benign neoplasm of pituitary gland: Secondary | ICD-10-CM | POA: Diagnosis not present

## 2018-01-04 DIAGNOSIS — E118 Type 2 diabetes mellitus with unspecified complications: Secondary | ICD-10-CM | POA: Diagnosis not present

## 2018-01-04 DIAGNOSIS — Z87440 Personal history of urinary (tract) infections: Secondary | ICD-10-CM | POA: Diagnosis not present

## 2018-01-04 DIAGNOSIS — D497 Neoplasm of unspecified behavior of endocrine glands and other parts of nervous system: Secondary | ICD-10-CM | POA: Diagnosis not present

## 2018-01-04 DIAGNOSIS — I10 Essential (primary) hypertension: Secondary | ICD-10-CM | POA: Diagnosis not present

## 2018-01-04 DIAGNOSIS — D352 Benign neoplasm of pituitary gland: Secondary | ICD-10-CM | POA: Diagnosis not present

## 2018-01-12 DIAGNOSIS — D352 Benign neoplasm of pituitary gland: Secondary | ICD-10-CM | POA: Diagnosis not present

## 2018-01-12 DIAGNOSIS — J3489 Other specified disorders of nose and nasal sinuses: Secondary | ICD-10-CM | POA: Diagnosis not present
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
## 2018-01-14 DIAGNOSIS — E1122 Type 2 diabetes mellitus with diabetic chronic kidney disease: Secondary | ICD-10-CM | POA: Diagnosis not present

## 2018-01-14 DIAGNOSIS — I10 Essential (primary) hypertension: Secondary | ICD-10-CM | POA: Diagnosis not present

## 2018-02-09 DIAGNOSIS — J323 Chronic sphenoidal sinusitis: Secondary | ICD-10-CM | POA: Diagnosis not present

## 2018-02-09 DIAGNOSIS — J3489 Other specified disorders of nose and nasal sinuses: Secondary | ICD-10-CM | POA: Diagnosis not present

## 2018-02-09 DIAGNOSIS — D352 Benign neoplasm of pituitary gland: Secondary | ICD-10-CM | POA: Diagnosis not present

## 2018-02-17 DIAGNOSIS — E1122 Type 2 diabetes mellitus with diabetic chronic kidney disease: Secondary | ICD-10-CM | POA: Diagnosis not present

## 2018-02-17 DIAGNOSIS — I10 Essential (primary) hypertension: Secondary | ICD-10-CM | POA: Diagnosis not present

## 2018-03-10 DIAGNOSIS — D352 Benign neoplasm of pituitary gland: Secondary | ICD-10-CM | POA: Diagnosis not present

## 2018-04-13 DIAGNOSIS — J3489 Other specified disorders of nose and nasal sinuses: Secondary | ICD-10-CM | POA: Diagnosis not present

## 2018-05-06 DIAGNOSIS — E1122 Type 2 diabetes mellitus with diabetic chronic kidney disease: Secondary | ICD-10-CM | POA: Diagnosis not present

## 2018-05-06 DIAGNOSIS — I10 Essential (primary) hypertension: Secondary | ICD-10-CM | POA: Diagnosis not present

## 2018-05-06 DIAGNOSIS — D352 Benign neoplasm of pituitary gland: Secondary | ICD-10-CM | POA: Diagnosis not present

## 2018-05-06 DIAGNOSIS — R5383 Other fatigue: Secondary | ICD-10-CM | POA: Diagnosis not present

## 2018-08-18 DIAGNOSIS — D352 Benign neoplasm of pituitary gland: Secondary | ICD-10-CM | POA: Diagnosis not present

## 2018-08-18 DIAGNOSIS — I10 Essential (primary) hypertension: Secondary | ICD-10-CM | POA: Diagnosis not present

## 2018-08-18 DIAGNOSIS — L630 Alopecia (capitis) totalis: Secondary | ICD-10-CM | POA: Diagnosis not present

## 2018-08-18 DIAGNOSIS — E118 Type 2 diabetes mellitus with unspecified complications: Secondary | ICD-10-CM | POA: Diagnosis not present

## 2018-08-31 DIAGNOSIS — R51 Headache: Secondary | ICD-10-CM | POA: Diagnosis not present

## 2018-08-31 DIAGNOSIS — D352 Benign neoplasm of pituitary gland: Secondary | ICD-10-CM | POA: Diagnosis not present

## 2018-08-31 DIAGNOSIS — J3489 Other specified disorders of nose and nasal sinuses: Secondary | ICD-10-CM | POA: Diagnosis not present

## 2018-08-31 DIAGNOSIS — G8929 Other chronic pain: Secondary | ICD-10-CM | POA: Diagnosis not present

## 2018-09-22 DIAGNOSIS — Z6827 Body mass index (BMI) 27.0-27.9, adult: Secondary | ICD-10-CM | POA: Diagnosis not present

## 2018-09-22 DIAGNOSIS — R110 Nausea: Secondary | ICD-10-CM | POA: Diagnosis not present

## 2018-09-22 DIAGNOSIS — R630 Anorexia: Secondary | ICD-10-CM | POA: Diagnosis not present

## 2018-09-22 DIAGNOSIS — Z79899 Other long term (current) drug therapy: Secondary | ICD-10-CM | POA: Diagnosis not present

## 2018-09-22 DIAGNOSIS — D352 Benign neoplasm of pituitary gland: Secondary | ICD-10-CM | POA: Diagnosis not present

## 2018-10-26 DIAGNOSIS — Z6828 Body mass index (BMI) 28.0-28.9, adult: Secondary | ICD-10-CM | POA: Diagnosis not present

## 2018-10-26 DIAGNOSIS — Z1231 Encounter for screening mammogram for malignant neoplasm of breast: Secondary | ICD-10-CM | POA: Diagnosis not present

## 2018-10-26 DIAGNOSIS — Z01419 Encounter for gynecological examination (general) (routine) without abnormal findings: Secondary | ICD-10-CM | POA: Diagnosis not present

## 2018-12-28 DIAGNOSIS — I10 Essential (primary) hypertension: Secondary | ICD-10-CM | POA: Diagnosis not present

## 2018-12-28 DIAGNOSIS — D352 Benign neoplasm of pituitary gland: Secondary | ICD-10-CM | POA: Diagnosis not present

## 2018-12-28 DIAGNOSIS — E1169 Type 2 diabetes mellitus with other specified complication: Secondary | ICD-10-CM | POA: Diagnosis not present

## 2018-12-28 DIAGNOSIS — Z6829 Body mass index (BMI) 29.0-29.9, adult: Secondary | ICD-10-CM | POA: Diagnosis not present

## 2019-01-18 DIAGNOSIS — H1045 Other chronic allergic conjunctivitis: Secondary | ICD-10-CM | POA: Diagnosis not present

## 2019-01-24 DIAGNOSIS — Z111 Encounter for screening for respiratory tuberculosis: Secondary | ICD-10-CM | POA: Diagnosis not present

## 2019-03-30 DIAGNOSIS — D352 Benign neoplasm of pituitary gland: Secondary | ICD-10-CM | POA: Diagnosis not present

## 2019-03-30 DIAGNOSIS — E119 Type 2 diabetes mellitus without complications: Secondary | ICD-10-CM | POA: Diagnosis not present

## 2019-03-30 DIAGNOSIS — D443 Neoplasm of uncertain behavior of pituitary gland: Secondary | ICD-10-CM | POA: Diagnosis not present

## 2019-06-27 DIAGNOSIS — Z20828 Contact with and (suspected) exposure to other viral communicable diseases: Secondary | ICD-10-CM | POA: Diagnosis not present

## 2019-07-01 DIAGNOSIS — L630 Alopecia (capitis) totalis: Secondary | ICD-10-CM | POA: Diagnosis not present

## 2019-07-01 DIAGNOSIS — D532 Scorbutic anemia: Secondary | ICD-10-CM | POA: Diagnosis not present

## 2019-07-01 DIAGNOSIS — I10 Essential (primary) hypertension: Secondary | ICD-10-CM | POA: Diagnosis not present

## 2019-07-01 DIAGNOSIS — D352 Benign neoplasm of pituitary gland: Secondary | ICD-10-CM | POA: Diagnosis not present

## 2019-07-01 DIAGNOSIS — E1169 Type 2 diabetes mellitus with other specified complication: Secondary | ICD-10-CM | POA: Diagnosis not present

## 2019-09-30 DIAGNOSIS — E1169 Type 2 diabetes mellitus with other specified complication: Secondary | ICD-10-CM | POA: Diagnosis not present

## 2019-09-30 DIAGNOSIS — I10 Essential (primary) hypertension: Secondary | ICD-10-CM | POA: Diagnosis not present

## 2019-12-20 DIAGNOSIS — E7800 Pure hypercholesterolemia, unspecified: Secondary | ICD-10-CM | POA: Diagnosis not present

## 2019-12-20 DIAGNOSIS — E1169 Type 2 diabetes mellitus with other specified complication: Secondary | ICD-10-CM | POA: Diagnosis not present

## 2019-12-20 DIAGNOSIS — I10 Essential (primary) hypertension: Secondary | ICD-10-CM | POA: Diagnosis not present

## 2019-12-20 DIAGNOSIS — E237 Disorder of pituitary gland, unspecified: Secondary | ICD-10-CM | POA: Diagnosis not present

## 2019-12-20 DIAGNOSIS — E785 Hyperlipidemia, unspecified: Secondary | ICD-10-CM | POA: Diagnosis not present

## 2019-12-24 DIAGNOSIS — E1169 Type 2 diabetes mellitus with other specified complication: Secondary | ICD-10-CM | POA: Diagnosis not present

## 2019-12-24 DIAGNOSIS — I10 Essential (primary) hypertension: Secondary | ICD-10-CM | POA: Diagnosis not present

## 2020-01-25 DIAGNOSIS — Z111 Encounter for screening for respiratory tuberculosis: Secondary | ICD-10-CM | POA: Diagnosis not present

## 2020-03-09 DIAGNOSIS — E785 Hyperlipidemia, unspecified: Secondary | ICD-10-CM | POA: Diagnosis not present

## 2020-03-09 DIAGNOSIS — I10 Essential (primary) hypertension: Secondary | ICD-10-CM | POA: Diagnosis not present

## 2020-03-09 DIAGNOSIS — E7800 Pure hypercholesterolemia, unspecified: Secondary | ICD-10-CM | POA: Diagnosis not present

## 2020-03-09 DIAGNOSIS — E1169 Type 2 diabetes mellitus with other specified complication: Secondary | ICD-10-CM | POA: Diagnosis not present

## 2020-03-26 DIAGNOSIS — L639 Alopecia areata, unspecified: Secondary | ICD-10-CM | POA: Diagnosis not present

## 2020-03-26 DIAGNOSIS — L309 Dermatitis, unspecified: Secondary | ICD-10-CM | POA: Diagnosis not present

## 2020-04-04 DIAGNOSIS — D352 Benign neoplasm of pituitary gland: Secondary | ICD-10-CM | POA: Diagnosis not present

## 2020-04-10 ENCOUNTER — Other Ambulatory Visit: Payer: Self-pay

## 2020-04-10 ENCOUNTER — Emergency Department (HOSPITAL_COMMUNITY): Payer: BC Managed Care – PPO

## 2020-04-10 ENCOUNTER — Encounter (HOSPITAL_COMMUNITY): Payer: Self-pay

## 2020-04-10 ENCOUNTER — Emergency Department (HOSPITAL_COMMUNITY)
Admission: EM | Admit: 2020-04-10 | Discharge: 2020-04-10 | Disposition: A | Discharge status: Home / Self Care | Payer: BC Managed Care – PPO | Attending: Emergency Medicine | Admitting: Emergency Medicine

## 2020-04-10 DIAGNOSIS — E119 Type 2 diabetes mellitus without complications: Secondary | ICD-10-CM | POA: Diagnosis not present

## 2020-04-10 DIAGNOSIS — R197 Diarrhea, unspecified: Secondary | ICD-10-CM | POA: Diagnosis not present

## 2020-04-10 DIAGNOSIS — R339 Retention of urine, unspecified: Secondary | ICD-10-CM | POA: Diagnosis not present

## 2020-04-10 DIAGNOSIS — R1110 Vomiting, unspecified: Secondary | ICD-10-CM | POA: Diagnosis not present

## 2020-04-10 DIAGNOSIS — I10 Essential (primary) hypertension: Secondary | ICD-10-CM | POA: Diagnosis not present

## 2020-04-10 DIAGNOSIS — R112 Nausea with vomiting, unspecified: Secondary | ICD-10-CM | POA: Diagnosis not present

## 2020-04-10 LAB — URINALYSIS, ROUTINE W REFLEX MICROSCOPIC
Bacteria, UA: NONE SEEN
Bilirubin Urine: NEGATIVE
Glucose, UA: >=500 mg/dL — AB
Hgb urine dipstick: NEGATIVE
Ketones, ur: 20 mg/dL — AB
Nitrite: NEGATIVE
Protein, ur: NEGATIVE mg/dL
Specific Gravity, Urine: 1.028 (ref 1.005–1.030)
pH: 5 (ref 5.0–8.0)

## 2020-04-10 LAB — HEPATIC FUNCTION PANEL
ALT: 23 U/L (ref 0–44)
AST: 23 U/L (ref 15–41)
Albumin: 4.6 g/dL (ref 3.5–5.0)
Alkaline Phosphatase: 65 U/L (ref 38–126)
Bilirubin, Direct: 0.3 mg/dL — ABNORMAL HIGH (ref 0.0–0.2)
Indirect Bilirubin: 2.1 mg/dL — ABNORMAL HIGH (ref 0.3–0.9)
Total Bilirubin: 2.4 mg/dL — ABNORMAL HIGH (ref 0.3–1.2)
Total Protein: 8.1 g/dL (ref 6.5–8.1)

## 2020-04-10 LAB — CBC
HCT: 44.8 % (ref 36.0–46.0)
Hemoglobin: 14.4 g/dL (ref 12.0–15.0)
MCH: 26.6 pg (ref 26.0–34.0)
MCHC: 32.1 g/dL (ref 30.0–36.0)
MCV: 82.7 fL (ref 80.0–100.0)
Platelets: 234 10*3/uL (ref 150–400)
RBC: 5.42 MIL/uL — ABNORMAL HIGH (ref 3.87–5.11)
RDW: 13.3 % (ref 11.5–15.5)
WBC: 5.5 10*3/uL (ref 4.0–10.5)
nRBC: 0 % (ref 0.0–0.2)

## 2020-04-10 LAB — BASIC METABOLIC PANEL
Anion gap: 14 (ref 5–15)
BUN: 17 mg/dL (ref 6–20)
CO2: 22 mmol/L (ref 22–32)
Calcium: 9.8 mg/dL (ref 8.9–10.3)
Chloride: 100 mmol/L (ref 98–111)
Creatinine, Ser: 0.81 mg/dL (ref 0.44–1.00)
GFR calc Af Amer: >60 mL/min (ref >60)
GFR calc non Af Amer: >60 mL/min (ref >60)
Glucose, Bld: 137 mg/dL — ABNORMAL HIGH (ref 70–99)
Potassium: 4.1 mmol/L (ref 3.5–5.1)
Sodium: 136 mmol/L (ref 135–145)

## 2020-04-10 LAB — CBG MONITORING, ED: Glucose-Capillary: 142 mg/dL — ABNORMAL HIGH (ref 70–99)

## 2020-04-10 LAB — LIPASE, BLOOD: Lipase: 23 U/L (ref 11–51)

## 2020-04-10 MED ORDER — SODIUM CHLORIDE (PF) 0.9 % IJ SOLN
INTRAMUSCULAR | Status: AC
  Filled 2020-04-10: qty 50

## 2020-04-10 MED ORDER — ONDANSETRON HCL 4 MG/2ML IJ SOLN
4.0000 mg | Freq: Once | INTRAMUSCULAR | Status: AC
  Administered 2020-04-10: 4 mg via INTRAVENOUS
  Filled 2020-04-10: qty 2

## 2020-04-10 MED ORDER — SODIUM CHLORIDE 0.9% FLUSH: 3.0000 mL | Freq: Once | INTRAVENOUS | Status: DC

## 2020-04-10 MED ORDER — ACETAMINOPHEN 325 MG PO TABS
650.0000 mg | ORAL_TABLET | Freq: Once | ORAL | Status: AC
  Administered 2020-04-10: 650 mg via ORAL
  Filled 2020-04-10: qty 2

## 2020-04-10 MED ORDER — IOHEXOL 300 MG/ML  SOLN
100.0000 mL | Freq: Once | INTRAMUSCULAR | Status: AC | PRN
  Administered 2020-04-10: 100 mL via INTRAVENOUS

## 2020-04-10 MED ORDER — ONDANSETRON 8 MG PO TBDP
8.0000 mg | ORAL_TABLET | Freq: Three times a day (TID) | ORAL | 0 refills | Status: DC | PRN
Start: 2020-04-10 — End: 2021-04-17

## 2020-04-10 NOTE — ED Provider Notes (Signed)
[facility] DEPT Provider Note   CSN: [id number] Arrival date & time: 04/10/20  0031     History Chief Complaint  Patient presents with  . Emesis    Whitney Mitchell is a 59 y.o. female.  HPI  59 yo female ho t2dm, pituitary tumor, hypertension complains of nausea and vomiting x 4 days,  No bile or blood, associated diarrhea began next day, associated headache, some chills but no definite fever although she has not taken it. Patient with some crampy left lower abdominal pain. No frequency or pain with urination, urine is dark.  Patient has not been keeping clear liquids down.  BS 100-140 without meds for 2 days. Patient had covid and was vaccinated in March moderna. She is followed at [facility] for pituitary tumor and is scheduled for now yearly mri June 27.  PMD Dr. Criss Rosales      Past Medical History:  Diagnosis Date  . Diabetes mellitus without complication ([place])   . Hypertension     There are no problems to display for this patient.   History reviewed. No pertinent surgical history.   OB History   No obstetric history on file.     No family history on file.  Social History   Tobacco Use  . Smoking status: Never Smoker  . Smokeless tobacco: Never Used  Vaping Use  . Vaping Use: Never used  Substance Use Topics  . Alcohol use: No  . Drug use: No    Home Medications Prior to Admission medications   Medication Sig Start Date End Date Taking? Authorizing Provider  amLODipine (NORVASC) 2.5 MG tablet Take 2.5 mg by mouth daily.    [provider]  BIOTIN PO Take 1 tablet by mouth daily.    [provider]  cephALEXin (KEFLEX) 500 MG capsule Take 1 capsule (500 mg total) by mouth 4 (four) times daily. 08/05/17   Palumbo, April, MD  empagliflozin (JARDIANCE) 25 MG TABS tablet Take 25 mg by mouth daily.    [provider]  IRBESARTAN PO Take by mouth.    [provider]  Multiple Vitamin (MULTIVITAMIN WITH  MINERALS) TABS tablet Take 1 tablet by mouth daily.    [provider]  Semaglutide (OZEMPIC) 0.25 or 0.5 MG/DOSE SOPN Inject 0.25 Units into the skin once a week.    [provider]  SPIRONOLACTONE PO Take 1 tablet by mouth daily.    [provider]    Allergies    Patient has no known allergies.  Review of Systems   Review of Systems  All other systems reviewed and are negative.   Physical Exam Updated Vital Signs BP 124/90 (BP Location: Right Arm)   Pulse 80   Temp 98.3 F (36.8 C) (Oral)   Resp 18   SpO2 99%   Physical Exam Vitals and nursing note reviewed.  Constitutional:      General: She is not in acute distress.    Appearance: Normal appearance. She is normal weight. She is not ill-appearing.  HENT:     Head: Normocephalic.     Right Ear: External ear normal.     Left Ear: External ear normal.     Mouth/Throat:     Mouth: Mucous membranes are moist.  Eyes:     Pupils: Pupils are equal, round, and reactive to light.  Cardiovascular:     Rate and Rhythm: Normal rate and regular rhythm.     Pulses: Normal pulses.  Heart sounds: Normal heart sounds.  Pulmonary:     Effort: Pulmonary effort is normal.     Breath sounds: Normal breath sounds.  Abdominal:     General: Abdomen is flat. Bowel sounds are normal.     Palpations: Abdomen is soft.     Comments: Mild ttp llq  Musculoskeletal:        General: Normal range of motion.  Skin:    General: Skin is warm and dry.     Capillary Refill: Capillary refill takes less than 2 seconds.  Neurological:     General: No focal deficit present.     Mental Status: She is alert and oriented to person, place, and time.     Cranial Nerves: No cranial nerve deficit.     Motor: No weakness.  Psychiatric:        Mood and Affect: Mood normal.     ED Results / Procedures / Treatments   Labs (all labs ordered are listed, but only abnormal results are displayed) Labs Reviewed  BASIC METABOLIC  PANEL - Abnormal; Notable for the following components:      Result Value   Glucose, Bld 137 (*)    All other components within normal limits  CBC - Abnormal; Notable for the following components:   RBC 5.42 (*)    All other components within normal limits  URINALYSIS, ROUTINE W REFLEX MICROSCOPIC - Abnormal; Notable for the following components:   Glucose, UA >=500 (*)    Ketones, ur 20 (*)    Leukocytes,Ua SMALL (*)    All other components within normal limits  CBG MONITORING, ED - Abnormal; Notable for the following components:   Glucose-Capillary 142 (*)    All other components within normal limits  LIPASE, BLOOD  HEPATIC FUNCTION PANEL  CBG MONITORING, ED    EKG EKG Interpretation  Date/Time:  Tuesday April 10 2020 01:38:31 EDT Ventricular Rate:  82 PR Interval:    QRS Duration: 90 QT Interval:  369 QTC Calculation: 431 R Axis:   16 Text Interpretation: Sinus rhythm Borderline low voltage, extremity leads When compared with ECG of 08/05/2017, No significant change was found Confirmed by Dione Booze ([id number]) on 04/10/2020 1:59:21 AM   Radiology No results found.  Procedures Procedures (including critical care time)  Medications Ordered in ED Medications  sodium chloride flush (NS) 0.9 % injection 3 mL (3 mLs Intravenous Not Given 04/10/20 3568)    ED Course  I have reviewed the triage vital signs and the nursing notes.  Pertinent labs & imaging results that were available during my care of the patient were reviewed by me and considered in my medical decision making (see chart for details).    MDM Rules/Calculators/A&P                         Labs normal Patient feels improved after fluids and antiemetics CT shows question of distended bladder.  Patient states that she does not feel that she needs to urinate at this time.  Will obtain post void residual Plan p.o. fluid challenge 1:44 PM Patient voided with post void residual of 500 cc Plan Foley catheter We  will culture urine Plan outpatient referral to urology Final Clinical Impression(s) / ED Diagnoses Final diagnoses:  Non-intractable vomiting with nausea, unspecified vomiting type  Urinary retention  Nausea vomiting and diarrhea    Rx / DC Orders ED Discharge Orders    None  Margarita Grizzle, MD 04/10/20 [phone]

## 2020-04-10 NOTE — ED Notes (Signed)
Post void residual greater than >500

## 2020-04-10 NOTE — Discharge Instructions (Signed)
Please call urology for recheck and catheter removal  Take zofran as needed for nausea Drink plenty of clear liquids

## 2020-04-10 NOTE — ED Triage Notes (Signed)
Patient arrived stating she has been constantly vomiting/diarrhea for the last few days. Also reports belching more than normal. Stating she is feeling weak and has no appetite.

## 2020-04-11 LAB — URINE CULTURE: Culture: <10000 — AB

## 2020-04-13 DIAGNOSIS — R3914 Feeling of incomplete bladder emptying: Secondary | ICD-10-CM | POA: Diagnosis not present

## 2020-04-13 DIAGNOSIS — B957 Other staphylococcus as the cause of diseases classified elsewhere: Secondary | ICD-10-CM | POA: Diagnosis not present

## 2020-04-13 DIAGNOSIS — R351 Nocturia: Secondary | ICD-10-CM | POA: Diagnosis not present

## 2020-04-13 DIAGNOSIS — N390 Urinary tract infection, site not specified: Secondary | ICD-10-CM | POA: Diagnosis not present

## 2020-04-13 DIAGNOSIS — R350 Frequency of micturition: Secondary | ICD-10-CM | POA: Diagnosis not present

## 2020-04-13 DIAGNOSIS — N3946 Mixed incontinence: Secondary | ICD-10-CM | POA: Diagnosis not present

## 2020-04-17 DIAGNOSIS — N3946 Mixed incontinence: Secondary | ICD-10-CM | POA: Diagnosis not present

## 2020-04-17 DIAGNOSIS — R3914 Feeling of incomplete bladder emptying: Secondary | ICD-10-CM | POA: Diagnosis not present

## 2020-06-03 DIAGNOSIS — G9389 Other specified disorders of brain: Secondary | ICD-10-CM | POA: Diagnosis not present

## 2020-06-03 DIAGNOSIS — D352 Benign neoplasm of pituitary gland: Secondary | ICD-10-CM | POA: Diagnosis not present

## 2020-06-20 DIAGNOSIS — D352 Benign neoplasm of pituitary gland: Secondary | ICD-10-CM | POA: Diagnosis not present

## 2020-07-09 DIAGNOSIS — Z01419 Encounter for gynecological examination (general) (routine) without abnormal findings: Secondary | ICD-10-CM | POA: Diagnosis not present

## 2020-07-09 DIAGNOSIS — Z6827 Body mass index (BMI) 27.0-27.9, adult: Secondary | ICD-10-CM | POA: Diagnosis not present

## 2020-07-09 DIAGNOSIS — Z1231 Encounter for screening mammogram for malignant neoplasm of breast: Secondary | ICD-10-CM | POA: Diagnosis not present

## 2020-09-19 DIAGNOSIS — E119 Type 2 diabetes mellitus without complications: Secondary | ICD-10-CM | POA: Diagnosis not present

## 2020-09-19 DIAGNOSIS — D443 Neoplasm of uncertain behavior of pituitary gland: Secondary | ICD-10-CM | POA: Diagnosis not present

## 2020-10-03 DIAGNOSIS — I10 Essential (primary) hypertension: Secondary | ICD-10-CM | POA: Diagnosis not present

## 2020-10-03 DIAGNOSIS — E1169 Type 2 diabetes mellitus with other specified complication: Secondary | ICD-10-CM | POA: Diagnosis not present

## 2020-11-06 DIAGNOSIS — Z86010 Personal history of colonic polyps: Secondary | ICD-10-CM | POA: Diagnosis not present

## 2020-11-06 DIAGNOSIS — K5904 Chronic idiopathic constipation: Secondary | ICD-10-CM | POA: Diagnosis not present

## 2020-11-06 DIAGNOSIS — Z1211 Encounter for screening for malignant neoplasm of colon: Secondary | ICD-10-CM | POA: Diagnosis not present

## 2020-11-06 DIAGNOSIS — R634 Abnormal weight loss: Secondary | ICD-10-CM | POA: Diagnosis not present

## 2020-12-10 DIAGNOSIS — K5730 Diverticulosis of large intestine without perforation or abscess without bleeding: Secondary | ICD-10-CM | POA: Diagnosis not present

## 2020-12-10 DIAGNOSIS — D3615 Benign neoplasm of peripheral nerves and autonomic nervous system of abdomen: Secondary | ICD-10-CM | POA: Diagnosis not present

## 2020-12-10 DIAGNOSIS — K635 Polyp of colon: Secondary | ICD-10-CM | POA: Diagnosis not present

## 2020-12-10 DIAGNOSIS — D125 Benign neoplasm of sigmoid colon: Secondary | ICD-10-CM | POA: Diagnosis not present

## 2020-12-10 DIAGNOSIS — Z1211 Encounter for screening for malignant neoplasm of colon: Secondary | ICD-10-CM | POA: Diagnosis not present

## 2021-02-21 ENCOUNTER — Other Ambulatory Visit: Payer: Self-pay | Admitting: Family Medicine

## 2021-02-21 ENCOUNTER — Ambulatory Visit
Admission: RE | Admit: 2021-02-21 | Discharge: 2021-02-21 | Disposition: A | Discharge status: Home / Self Care | Payer: BC Managed Care – PPO | Source: Ambulatory Visit | Attending: Family Medicine | Admitting: Family Medicine

## 2021-02-21 ENCOUNTER — Other Ambulatory Visit: Payer: Self-pay

## 2021-02-21 DIAGNOSIS — M25561 Pain in right knee: Secondary | ICD-10-CM

## 2021-02-21 DIAGNOSIS — M25571 Pain in right ankle and joints of right foot: Secondary | ICD-10-CM

## 2021-02-21 DIAGNOSIS — E1122 Type 2 diabetes mellitus with diabetic chronic kidney disease: Secondary | ICD-10-CM | POA: Diagnosis not present

## 2021-02-21 DIAGNOSIS — M25511 Pain in right shoulder: Secondary | ICD-10-CM

## 2021-02-21 DIAGNOSIS — I10 Essential (primary) hypertension: Secondary | ICD-10-CM | POA: Diagnosis not present

## 2021-02-21 DIAGNOSIS — M19011 Primary osteoarthritis, right shoulder: Secondary | ICD-10-CM | POA: Diagnosis not present

## 2021-02-21 DIAGNOSIS — E1169 Type 2 diabetes mellitus with other specified complication: Secondary | ICD-10-CM | POA: Diagnosis not present

## 2021-02-21 DIAGNOSIS — E785 Hyperlipidemia, unspecified: Secondary | ICD-10-CM | POA: Diagnosis not present

## 2021-02-26 DIAGNOSIS — I10 Essential (primary) hypertension: Secondary | ICD-10-CM | POA: Diagnosis not present

## 2021-02-26 DIAGNOSIS — E1169 Type 2 diabetes mellitus with other specified complication: Secondary | ICD-10-CM | POA: Diagnosis not present

## 2021-03-12 DIAGNOSIS — E1169 Type 2 diabetes mellitus with other specified complication: Secondary | ICD-10-CM | POA: Diagnosis not present

## 2021-03-12 DIAGNOSIS — I10 Essential (primary) hypertension: Secondary | ICD-10-CM | POA: Diagnosis not present

## 2021-03-26 DIAGNOSIS — D352 Benign neoplasm of pituitary gland: Secondary | ICD-10-CM | POA: Diagnosis not present

## 2021-03-26 DIAGNOSIS — E1169 Type 2 diabetes mellitus with other specified complication: Secondary | ICD-10-CM | POA: Diagnosis not present

## 2021-03-26 DIAGNOSIS — I10 Essential (primary) hypertension: Secondary | ICD-10-CM | POA: Diagnosis not present

## 2021-03-26 DIAGNOSIS — E785 Hyperlipidemia, unspecified: Secondary | ICD-10-CM | POA: Diagnosis not present

## 2021-04-11 DIAGNOSIS — D352 Benign neoplasm of pituitary gland: Secondary | ICD-10-CM | POA: Diagnosis not present

## 2021-04-11 DIAGNOSIS — R519 Headache, unspecified: Secondary | ICD-10-CM | POA: Diagnosis not present

## 2021-04-12 DIAGNOSIS — E1169 Type 2 diabetes mellitus with other specified complication: Secondary | ICD-10-CM | POA: Diagnosis not present

## 2021-04-16 DIAGNOSIS — E1169 Type 2 diabetes mellitus with other specified complication: Secondary | ICD-10-CM | POA: Diagnosis not present

## 2021-04-16 DIAGNOSIS — I10 Essential (primary) hypertension: Secondary | ICD-10-CM | POA: Diagnosis not present

## 2021-04-16 DIAGNOSIS — K2100 Gastro-esophageal reflux disease with esophagitis, without bleeding: Secondary | ICD-10-CM | POA: Diagnosis not present

## 2021-04-17 ENCOUNTER — Other Ambulatory Visit: Payer: Self-pay

## 2021-04-17 ENCOUNTER — Encounter: Payer: Self-pay | Admitting: Cardiology

## 2021-04-17 ENCOUNTER — Ambulatory Visit: Payer: BC Managed Care – PPO | Admitting: Cardiology

## 2021-04-17 VITALS — BP 117/85 | HR 76 | Temp 97.9°F | Resp 16 | Ht 65.0 in | Wt 152.0 lb

## 2021-04-17 DIAGNOSIS — I208 Other forms of angina pectoris: Secondary | ICD-10-CM

## 2021-04-17 DIAGNOSIS — R0609 Other forms of dyspnea: Secondary | ICD-10-CM

## 2021-04-17 DIAGNOSIS — E119 Type 2 diabetes mellitus without complications: Secondary | ICD-10-CM | POA: Insufficient documentation

## 2021-04-17 DIAGNOSIS — E1165 Type 2 diabetes mellitus with hyperglycemia: Secondary | ICD-10-CM | POA: Diagnosis not present

## 2021-04-17 MED ORDER — METOPROLOL TARTRATE 50 MG PO TABS
50.0000 mg | ORAL_TABLET | ORAL | 0 refills | Status: DC
Start: 2021-04-17 — End: 2021-04-26

## 2021-04-17 NOTE — Progress Notes (Signed)
Patient referred by Lucianne Lei, MD for dyspnea on exertion  Subjective:   Whitney Mitchell, female    DOB: 1961/01/20, 60 y.o.   MRN: [id number]   Chief Complaint  Patient presents with   Shortness of Breath   New Patient (Initial Visit)     HPI  60 y.o. African-American female with hypertension, type 2 diabetes mellitus, now with exertional dyspnea.  Patient is a CNA.  She has had uncontrolled diabetes with A1c as high as 13%.  Reportedly, her blood sugars are now controlled on Ozempic and Jardiance.  She is also lost several pounds to starting close epic.  However, for last 2 months, she has had exertional dyspnea her usual work activities.  She denies any orthopnea, PND, leg edema, chest pain.   Past Medical History:  Diagnosis Date   Diabetes mellitus without complication ([place])    Hypertension      Past Surgical History:  Procedure Laterality Date   BRAIN SURGERY  2019     Social History   Tobacco Use  Smoking Status Never  Smokeless Tobacco Never    Social History   Substance and Sexual Activity  Alcohol Use No     Family History  Problem Relation Age of Onset   Heart attack Father      Current Outpatient Medications on File Prior to Visit  Medication Sig Dispense Refill   empagliflozin (JARDIANCE) 25 MG TABS tablet Take 25 mg by mouth daily.     insulin glargine, 2 Unit Dial, (TOUJEO MAX SOLOSTAR) 300 UNIT/ML Solostar Pen Inject 15 Units into the skin in the morning.     irbesartan-hydrochlorothiazide (AVALIDE) 150-12.5 MG tablet Take 1 tablet by mouth daily.     Multiple Vitamin (MULTIVITAMIN WITH MINERALS) TABS tablet Take 1 tablet by mouth daily.     ondansetron (ZOFRAN ODT) 8 MG disintegrating tablet Take 1 tablet (8 mg total) by mouth every 8 (eight) hours as needed for nausea or vomiting. 20 tablet 0   rosuvastatin (CRESTOR) 20 MG tablet Take 20 mg by mouth daily.     Semaglutide, 1 MG/DOSE, (OZEMPIC, 1 MG/DOSE,) 2 MG/1.5ML SOPN Inject 1 mg  into the skin once a week. Sundays     spironolactone (ALDACTONE) 25 MG tablet Take 25 mg by mouth every morning.     No current facility-administered medications on file prior to visit.    Cardiovascular and other pertinent studies:  EKG 04/17/2021: Sinus rhythm 75 bpm  Low voltage, otherwise normal EKG   Recent labs: 02/22/2021: Glucose 366, BUN/Cr 14/0.69. EGFR 94. Na/K 133/4.1. Rest of the CMP normal H/H 13/41. MCV 82. Platelets 169 HbA1C 13.5% Chol 131, TG 48, HDL 62, LDL 55 TSH 1.9 normal    Review of Systems  Cardiovascular:  Positive for dyspnea on exertion. Negative for chest pain, leg swelling, palpitations and syncope.        Vitals:   04/17/21 0856  BP: 117/85  Pulse: 76  Resp: 16  Temp: 97.9 F (36.6 C)  SpO2: 98%     Body mass index is 25.29 kg/m. Filed Weights   04/17/21 0856  Weight: 152 lb (68.9 kg)     Objective:   Physical Exam Vitals and nursing note reviewed.  Constitutional:      General: She is not in acute distress. Neck:     Vascular: No JVD.  Cardiovascular:     Rate and Rhythm: Normal rate and regular rhythm.     Pulses: Normal pulses.  Heart sounds: Normal heart sounds. No murmur heard. Pulmonary:     Effort: Pulmonary effort is normal.     Breath sounds: Normal breath sounds. No wheezing or rales.  Musculoskeletal:     Right lower leg: No edema.     Left lower leg: Edema present.         Assessment & Recommendations:   60 y.o. African-American female with hypertension, type 2 diabetes mellitus, now with exertional dyspnea  Exertional dyspnea: Given her history of uncontrolled diabetes mellitus, this could be an anginal equivalent symptom.  Recommend echocardiogram, and CT coronary angiogram for coronary anatomy and physiology evaluation.  Recommend  metoprolol tartrate 50 mg twice daily use CT scan. Continue current diabetes and lipid-lowering therapy.  Thank you for referring the patient to [place]. Please feel  free to contact with any questions.   Nigel Mormon, MD Pager: [phone] Office: [phone]

## 2021-04-17 NOTE — Patient Instructions (Addendum)
Your cardiac CT will be scheduled at the locations below:   [facility]  [address]  [place], [place] [address]  [phone]    If scheduled at [facility], please arrive at the [facility] main entrance of [facility] 30-45 minutes prior to test start time.  Proceed to the [facility] Radiology Department (first floor) to check-in and test prep.   Please follow these instructions carefully (unless otherwise directed):    On the Night Before the Test:   Be sure to Drink plenty of water.   Do not consume any caffeinated/decaffeinated beverages or chocolate 12 hours prior to your test.   Do not take any antihistamines 12 hours prior to your test.   If the patient has contrast allergy:  Patient will need a prescription for Prednisone and very clear instructions (as follows):  1. Prednisone 50 mg - take 13 hours prior to test  2. Take another Prednisone 50 mg 7 hours prior to test  3. Take another Prednisone 50 mg 1 hour prior to test  4. Take Benadryl 50 mg 1 hour prior to test   Patient must complete all four doses of above prophylactic medications.   Patient will need a ride after test due to Benadryl.   On the Day of the Test:   Drink plenty of water. Do not drink any water within one hour of the test.   Do not eat any food 4 hours prior to the test.   You may take your regular medications prior to the test.    - Metoprolol tartarate 50 mg twice daily starting 2 days before CT scan On the day of CT scan, take it 2 hours before CT scan You may stop it after the CT scan, unless specified otherwise by me.    FEMALES- please wear underwire-free bra if available          After the Test:   Drink plenty of water.   After receiving IV contrast, you may experience a mild flushed feeling. This is normal.   On occasion, you may experience a mild rash up to 24 hours after the test. This is not dangerous. If this occurs, you can take  Benadryl 25 mg and increase your fluid intake.   If you experience trouble breathing, this can be serious. If it is severe call 911 IMMEDIATELY. If it is mild, please call our office.   If you take any of these medications: Glipizide/Metformin, Avandament, Glucavance, please do not take 48 hours after completing test unless otherwise instructed.     Please contact the cardiac imaging nurse navigator should you have any questions/concerns  Rockwell Alexandria, RN Navigator Cardiac Imaging  [facility] Heart and Vascular Services  [phone] Office  [phone] Cell  Recommend

## 2021-04-23 ENCOUNTER — Other Ambulatory Visit: Payer: Self-pay

## 2021-04-23 ENCOUNTER — Ambulatory Visit: Payer: BC Managed Care – PPO

## 2021-04-23 DIAGNOSIS — R0600 Dyspnea, unspecified: Secondary | ICD-10-CM

## 2021-04-23 DIAGNOSIS — I208 Other forms of angina pectoris: Secondary | ICD-10-CM

## 2021-04-23 DIAGNOSIS — R0609 Other forms of dyspnea: Secondary | ICD-10-CM

## 2021-04-23 DIAGNOSIS — E1165 Type 2 diabetes mellitus with hyperglycemia: Secondary | ICD-10-CM

## 2021-04-24 DIAGNOSIS — R519 Headache, unspecified: Secondary | ICD-10-CM | POA: Diagnosis not present

## 2021-04-24 DIAGNOSIS — G9389 Other specified disorders of brain: Secondary | ICD-10-CM | POA: Diagnosis not present

## 2021-04-24 DIAGNOSIS — D352 Benign neoplasm of pituitary gland: Secondary | ICD-10-CM | POA: Diagnosis not present

## 2021-04-26 ENCOUNTER — Other Ambulatory Visit: Payer: Self-pay

## 2021-04-26 DIAGNOSIS — R0609 Other forms of dyspnea: Secondary | ICD-10-CM

## 2021-04-26 DIAGNOSIS — R0600 Dyspnea, unspecified: Secondary | ICD-10-CM

## 2021-04-26 DIAGNOSIS — I208 Other forms of angina pectoris: Secondary | ICD-10-CM

## 2021-04-26 DIAGNOSIS — E1165 Type 2 diabetes mellitus with hyperglycemia: Secondary | ICD-10-CM

## 2021-04-26 MED ORDER — METOPROLOL TARTRATE 50 MG PO TABS: 50.0000 mg | ORAL_TABLET | ORAL | 0 refills | Status: DC

## 2021-04-26 NOTE — Addendum Note (Signed)
Addended by: Elder Negus on: 04/26/2021 10:18 AM   Modules accepted: Orders

## 2021-04-29 ENCOUNTER — Telehealth (HOSPITAL_COMMUNITY): Payer: Self-pay | Admitting: Emergency Medicine

## 2021-04-29 NOTE — Telephone Encounter (Signed)
VM box full, cannot leave message for callback Rockwell Alexandria RN Navigator Cardiac Imaging [facility] [phone] Office  [phone] Cell

## 2021-04-29 NOTE — Telephone Encounter (Signed)
Pt returning phone call regarding upcoming cardiac imaging study; pt verbalizes understanding of appt date/time, parking situation and where to check in, pre-test NPO status and medications ordered, and verified current allergies; name and call back number provided for further questions should they arise Rockwell Alexandria RN Navigator Cardiac Imaging [facility] [phone] office [phone] cell   Pt started taking 50mg  metop tart yesterday with plan to take tomorrow morning and 2 hr prior to scan Denies IV issues other than fear of needles Denies claustro 

## 2021-04-30 ENCOUNTER — Other Ambulatory Visit: Payer: Self-pay

## 2021-04-30 ENCOUNTER — Ambulatory Visit (HOSPITAL_COMMUNITY)
Admission: RE | Admit: 2021-04-30 | Discharge: 2021-04-30 | Disposition: A | Discharge status: Home / Self Care | Payer: BC Managed Care – PPO | Source: Ambulatory Visit | Attending: Cardiology | Admitting: Cardiology

## 2021-04-30 ENCOUNTER — Encounter (HOSPITAL_COMMUNITY): Payer: Self-pay

## 2021-04-30 DIAGNOSIS — E1165 Type 2 diabetes mellitus with hyperglycemia: Secondary | ICD-10-CM

## 2021-04-30 DIAGNOSIS — R0600 Dyspnea, unspecified: Secondary | ICD-10-CM | POA: Insufficient documentation

## 2021-04-30 DIAGNOSIS — R0609 Other forms of dyspnea: Secondary | ICD-10-CM | POA: Diagnosis not present

## 2021-04-30 DIAGNOSIS — I208 Other forms of angina pectoris: Secondary | ICD-10-CM

## 2021-04-30 DIAGNOSIS — K449 Diaphragmatic hernia without obstruction or gangrene: Secondary | ICD-10-CM | POA: Insufficient documentation

## 2021-04-30 MED ORDER — NITROGLYCERIN 0.4 MG SL SUBL
SUBLINGUAL_TABLET | SUBLINGUAL | Status: AC
  Administered 2021-04-30: 0.8 mg via SUBLINGUAL
  Filled 2021-04-30: qty 2

## 2021-04-30 MED ORDER — IOHEXOL 350 MG/ML SOLN
95.0000 mL | Freq: Once | INTRAVENOUS | Status: AC | PRN
  Administered 2021-04-30: 95 mL via INTRAVENOUS

## 2021-04-30 MED ORDER — NITROGLYCERIN 0.4 MG SL SUBL: 0.8000 mg | SUBLINGUAL_TABLET | Freq: Once | SUBLINGUAL | Status: AC

## 2021-05-05 ENCOUNTER — Ambulatory Visit (HOSPITAL_COMMUNITY)
Admission: RE | Admit: 2021-05-05 | Discharge: 2021-05-05 | Disposition: A | Discharge status: Home / Self Care | Payer: BC Managed Care – PPO | Source: Ambulatory Visit | Attending: Cardiology | Admitting: Cardiology

## 2021-05-05 ENCOUNTER — Other Ambulatory Visit (HOSPITAL_COMMUNITY): Payer: Self-pay | Admitting: Cardiology

## 2021-05-05 ENCOUNTER — Other Ambulatory Visit: Payer: Self-pay | Admitting: Cardiology

## 2021-05-05 DIAGNOSIS — R0609 Other forms of dyspnea: Secondary | ICD-10-CM

## 2021-05-05 DIAGNOSIS — R931 Abnormal findings on diagnostic imaging of heart and coronary circulation: Secondary | ICD-10-CM | POA: Insufficient documentation

## 2021-05-05 DIAGNOSIS — R0600 Dyspnea, unspecified: Secondary | ICD-10-CM | POA: Insufficient documentation

## 2021-05-05 DIAGNOSIS — R9431 Abnormal electrocardiogram [ECG] [EKG]: Secondary | ICD-10-CM | POA: Insufficient documentation

## 2021-05-16 NOTE — Progress Notes (Signed)
Patient referred by Lucianne Lei, MD for dyspnea on exertion  Subjective:   Whitney Mitchell, female    DOB: [birth year], 60 y.o.   MRN: [id number]    Chief Complaint  Patient presents with   Dyspnea on exertion   Results   Follow-up      HPI  60 y.o. African-American female with hypertension, type 2 diabetes mellitus, exertional dyspnea.  Patient is working diligently on her diabetes.  She proudly tells me that her blood sugars are 98% within normal limits.  She has not had any chest pain.  Dyspnea is improving.  Discussed recent test results with the patient, details below.  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  Reportedly, her blood sugars are now controlled on Ozempic and Jardiance.  She is also lost several pounds to starting close epic.  However, for last 2 months, she has had exertional dyspnea her usual work activities.  She denies any orthopnea, PND, leg edema, chest pain.    Current Outpatient Medications on File Prior to Visit  Medication Sig Dispense Refill   empagliflozin (JARDIANCE) 25 MG TABS tablet Take 25 mg by mouth daily.     irbesartan-hydrochlorothiazide (AVALIDE) 150-12.5 MG tablet Take 1 tablet by mouth daily.     metoprolol tartrate (LOPRESSOR) 50 MG tablet Take 1 tablet (50 mg total) by mouth as directed. 50 mg twice daily starting 2 days before CT scan On the day of CT scan, take it 2 hours before CT scan You may stop it after the CT scan, unless specified otherwise by me 15 tablet 0   Multiple Vitamin (MULTIVITAMIN WITH MINERALS) TABS tablet Take 1 tablet by mouth daily.     rosuvastatin (CRESTOR) 20 MG tablet Take 20 mg by mouth daily.     Semaglutide, 1 MG/DOSE, (OZEMPIC, 1 MG/DOSE,) 2 MG/1.5ML SOPN Inject 1 mg into the skin once a week. Sundays     spironolactone (ALDACTONE) 25 MG tablet Take 25 mg by mouth every morning.     No current facility-administered medications on file  prior to visit.    Cardiovascular and other pertinent studies:  CTA coronary 04/30/2021: 1. Total coronary calcium score of 0. 2. Normal coronary origin with left dominance. 3. Findings suggestive of patent foramen ovale. 3. CAD-RADS = 3.  LM: Patent. LAD: Moderate stenosis (50-69%) due to non calcified plaque at mid LAD (closer to 50%) but the accuracy of this lesion is reduced due to presence of artifact. LCX: Mild stenosis (25-49%) due to non calcified eccentric plaque in the mid LCx. RCA: Mild stenosis (25-49%) within proximal / mid RCA  FFR LM: 0.98 LAD: Prox 0.98, mid 0.97, distal 0.91 LCX: Prox 0.98, distal 0.95 RCA: Prox 0.99, mid 0.94, distal not modeled   Echocardiogram 04/23/2021:  Left ventricle cavity is normal in size and wall thickness. Normal global  wall motion. Normal LV systolic function with EF 58%. Doppler evidence of  grade I (impaired) diastolic dysfunction, normal LAP.  Mild tricuspid regurgitation.  No evidence of pulmonary hypertension.  No significant change compared to previous study in 2014.   EKG 04/17/2021: Sinus rhythm 75 bpm  Low voltage, otherwise normal EKG   Recent labs: 02/22/2021: Glucose 366, BUN/Cr 14/0.69. EGFR 94. Na/K 133/4.1. Rest of the CMP normal H/H 13/41. MCV 82. Platelets 169 HbA1C 13.5% Chol 131, TG 48, HDL 62, LDL 55 TSH 1.9 normal    Review of Systems  Cardiovascular:  Positive for dyspnea on exertion. Negative for chest pain, leg swelling, palpitations and syncope.        Vitals:   05/17/21 0837  BP: 128/78  Pulse: 77  Resp: 16  Temp: 98 F (36.7 C)  SpO2: 98%     Body mass index is 24.76 kg/m. Filed Weights   05/17/21 0837  Weight: 148 lb 12.8 oz (67.5 kg)     Objective:   Physical Exam Vitals and nursing note reviewed.  Constitutional:      General: She is not in acute distress. Neck:     Vascular: No JVD.  Cardiovascular:     Rate and Rhythm: Normal rate and regular rhythm.     Pulses:  Normal pulses.     Heart sounds: Normal heart sounds. No murmur heard. Pulmonary:     Effort: Pulmonary effort is normal.     Breath sounds: Normal breath sounds. No wheezing or rales.  Musculoskeletal:     Right lower leg: No edema.     Left lower leg: Edema present.         Assessment & Recommendations:   60 y.o. African-American female with hypertension, type 2 diabetes mellitus, nonobstructive CAD, exertional dyspnea  Exertional dyspnea: Improving.  Mild nonobstructive coronary artery disease.  Okay not to use aspirin.  Continue aggressive risk factor modification, including diabetes, hyperlipidemia and hypertension control.  I congratulated patient on her improved diabetes control.  Follow-up in 6 months.  At that time, patient is asymptomatic, we will see her as needed.    Nigel Mormon, MD Pager: [phone] Office: [phone]

## 2021-05-17 ENCOUNTER — Other Ambulatory Visit: Payer: Self-pay

## 2021-05-17 ENCOUNTER — Ambulatory Visit: Payer: BC Managed Care – PPO | Admitting: Cardiology

## 2021-05-17 ENCOUNTER — Encounter: Payer: Self-pay | Admitting: Cardiology

## 2021-05-17 VITALS — BP 128/78 | HR 77 | Temp 98.0°F | Resp 16 | Ht 65.0 in | Wt 148.8 lb

## 2021-05-17 DIAGNOSIS — E1165 Type 2 diabetes mellitus with hyperglycemia: Secondary | ICD-10-CM | POA: Diagnosis not present

## 2021-05-17 DIAGNOSIS — R0600 Dyspnea, unspecified: Secondary | ICD-10-CM

## 2021-05-17 DIAGNOSIS — R0609 Other forms of dyspnea: Secondary | ICD-10-CM | POA: Diagnosis not present

## 2021-05-17 DIAGNOSIS — I25118 Atherosclerotic heart disease of native coronary artery with other forms of angina pectoris: Secondary | ICD-10-CM | POA: Insufficient documentation

## 2021-05-28 DIAGNOSIS — E785 Hyperlipidemia, unspecified: Secondary | ICD-10-CM | POA: Diagnosis not present

## 2021-05-28 DIAGNOSIS — I10 Essential (primary) hypertension: Secondary | ICD-10-CM | POA: Diagnosis not present

## 2021-05-28 DIAGNOSIS — E1169 Type 2 diabetes mellitus with other specified complication: Secondary | ICD-10-CM | POA: Diagnosis not present

## 2021-05-28 DIAGNOSIS — E039 Hypothyroidism, unspecified: Secondary | ICD-10-CM | POA: Diagnosis not present

## 2021-09-16 DIAGNOSIS — Z6824 Body mass index (BMI) 24.0-24.9, adult: Secondary | ICD-10-CM | POA: Diagnosis not present

## 2021-09-16 DIAGNOSIS — Z01419 Encounter for gynecological examination (general) (routine) without abnormal findings: Secondary | ICD-10-CM | POA: Diagnosis not present

## 2021-09-16 DIAGNOSIS — Z1231 Encounter for screening mammogram for malignant neoplasm of breast: Secondary | ICD-10-CM | POA: Diagnosis not present

## 2021-09-23 DIAGNOSIS — D443 Neoplasm of uncertain behavior of pituitary gland: Secondary | ICD-10-CM | POA: Diagnosis not present

## 2021-09-23 DIAGNOSIS — E119 Type 2 diabetes mellitus without complications: Secondary | ICD-10-CM | POA: Diagnosis not present

## 2021-12-17 ENCOUNTER — Other Ambulatory Visit (HOSPITAL_COMMUNITY): Payer: Self-pay

## 2021-12-17 MED ORDER — OZEMPIC (1 MG/DOSE) 4 MG/3ML ~~LOC~~ SOPN
1.0000 mg | PEN_INJECTOR | SUBCUTANEOUS | 1 refills | Status: DC
  Filled 2021-12-17 – 2022-01-01 (×3): qty 9, 84d supply, fill #0

## 2022-01-01 ENCOUNTER — Other Ambulatory Visit (HOSPITAL_COMMUNITY): Payer: Self-pay

## 2022-04-03 ENCOUNTER — Other Ambulatory Visit (HOSPITAL_COMMUNITY): Payer: Self-pay

## 2022-04-03 MED ORDER — OZEMPIC (1 MG/DOSE) 4 MG/3ML ~~LOC~~ SOPN
1.0000 mg | PEN_INJECTOR | SUBCUTANEOUS | 1 refills | Status: DC
  Filled 2022-04-03: qty 9, 84d supply, fill #0
  Filled 2022-08-19: qty 9, 84d supply, fill #1

## 2022-05-01 NOTE — Progress Notes (Signed)
 Neurosurgery-Evaluation  Identifying Statement: Whitney Mitchell is a 61 y.o. female from [place] [address] with pituitary adenoma s/p resection  12/2017   The patient is seen in consultation at the request of Benjamine Kennieth Rung, MD  for the problem of above  and correspondence will be forwarded to their office.  History of Present Illness:   Whitney Mitchell presents to clinic for yearly MRI follow up.   Past Medical History:  Past Medical History:  Diagnosis Date  . Diabetes mellitus type 2, uncomplicated (CMS-HCC)   . Hypertension   . Pituitary adenoma (CMS-HCC)     Review of Systems: Constitutional:  Positive for unexpected weight change.  Allergic/Immunologic: Positive for environmental allergies.  All other systems reviewed and are negative.   Physical Exam: Blood pressure 134/87, heart rate 72, height 164 cm, weight 70.1 kg General appearance: alert, cooperative, pleasant, in no acute distress Head: Normocephalic, atraumatic Eyes: conjunctiva/corneas normal, PERRL Oropharynx: moist without lesions, teeth in good repair Neck: supple and no JVD Heart:  Lungs: good air exchange  (Exposed) Skin: Without bruises or discoloration Respirations: Even and unlabored  Neurologic exam:  Mental status: alertness: alert, orientation: person, place, time, affect: normal Speech: fluent Cranial nerves:  II: Visual field visual fields are full by confrontation,pupils equal, round, reactive to light and accommodation, no ptosis III/IV/VI: extra-ocular motions intact bilaterally V/VII:no evidence of facial droop or weakness VIII: hearing normal IX: soft palate elevation normal midline IX,X: gag reflex present XI: trapezius strength symmetric,  sternocleidomastoid strength symmetric XII: tongue strength symmetric  Motor:strength symmetric 5/5, normal muscle mass and tone in all extremities Gait: normal .   Imaging personally reviewed: Narrative & Impression  Procedure: MRI BRAIN AND  PITUITARY WITHOUT AND WITH CONTRAST   INDICATION: pituitary adenoma, D35.2 Benign neoplasm of pituitary gland (CMS-HCC)   COMPARISON: MRI brain 04/24/2021   TECHNIQUE/PROTOCOL: Standard brain and pituitary pre and post contrast MRI performed.   FINDINGS:  Pituitary Gland: Increased size of the enhancing expansile lesion and within the sella measuring up to 2.3 x 1.1 cm (AP X CC), previously 2.3 cm x 1.1 cm with involvement of the left cavernous sinus. This abuts the bilateral cavernous ICAs with unremarkable appearance of the flow voids. Infundibulum: Normal in appearance and without deviation. Hypothalamus: Normal. Optic Chiasm: Normal. Mammillary Bodies: Normal.   Brain Parenchyma:  No hemorrhage, cerebral edema, acute cortical infarction, mass, mass effect, or midline shift. Ventricles and Sulci: Normal for age. Redemonstrated intraventricular lipoma.   Extra-Axial Spaces: No extra-axial fluid collection. Basal Cisterns: Normal. Vasculature: The courses of the internal carotid arteries are normal. The intracranial flow-voids are normal.   Paranasal Sinuses: Normal. Mastoid air cells: Normal. Orbits: Normal. Cranium: Normal.     IMPRESSION:  Stable size of the enhancing sellar mass with involvement of the left cavernous sinus. Unremarkable appearance of the bilateral cavernous ICA flow voids.    Electronically Reviewed by:  Devin Cao, MD, [facility] Electronically Reviewed on:  05/01/2022 3:53 PM     Impression/Plan:  Pituitary adenoma   Call for any change or concern.   Brain MRI w/wo contrast in 2 years.   Patient knows to call us  at that time to schedule.    Issues concerning treatment and diagnosis were discussed with the patient. There are no barriers understanding the plan of treatment. Explanation was well received by patient and/or family who then verbalized understanding, and updated reconciled list of the patient's medications was reviewed with and  provided to the patient.  Thank you for the opportunity to care for this patient.   This encounter was conducted with a moderate  degree of medical decision making.    Attestation Statement:   I personally performed the service, non-incident to. (WP)   DENISE CHRISTELLA HUM, NP

## 2022-08-19 ENCOUNTER — Other Ambulatory Visit (HOSPITAL_COMMUNITY): Payer: Self-pay

## 2022-08-27 ENCOUNTER — Other Ambulatory Visit (HOSPITAL_COMMUNITY): Payer: Self-pay

## 2023-07-14 ENCOUNTER — Other Ambulatory Visit: Payer: Self-pay | Admitting: Family Medicine

## 2023-07-14 ENCOUNTER — Ambulatory Visit
Admission: RE | Admit: 2023-07-14 | Discharge: 2023-07-14 | Disposition: A | Discharge status: Home / Self Care | Payer: BC Managed Care – PPO | Source: Ambulatory Visit | Attending: Family Medicine | Admitting: Family Medicine

## 2023-07-14 DIAGNOSIS — M545 Low back pain, unspecified: Secondary | ICD-10-CM

## 2023-07-14 DIAGNOSIS — M549 Dorsalgia, unspecified: Secondary | ICD-10-CM

## 2024-04-19 ENCOUNTER — Other Ambulatory Visit: Payer: Self-pay

## 2024-04-19 ENCOUNTER — Emergency Department (HOSPITAL_COMMUNITY): Payer: Self-pay

## 2024-04-19 ENCOUNTER — Inpatient Hospital Stay (HOSPITAL_COMMUNITY)
Admission: EM | Admit: 2024-04-19 | Discharge: 2024-04-22 | DRG: 698 | Disposition: A | Discharge status: Home / Self Care | Payer: Self-pay | Attending: Internal Medicine | Admitting: Internal Medicine

## 2024-04-19 ENCOUNTER — Encounter (HOSPITAL_COMMUNITY): Payer: Self-pay | Admitting: Emergency Medicine

## 2024-04-19 DIAGNOSIS — Z7984 Long term (current) use of oral hypoglycemic drugs: Secondary | ICD-10-CM

## 2024-04-19 DIAGNOSIS — Z1611 Resistance to penicillins: Secondary | ICD-10-CM | POA: Diagnosis present

## 2024-04-19 DIAGNOSIS — R634 Abnormal weight loss: Secondary | ICD-10-CM | POA: Diagnosis present

## 2024-04-19 DIAGNOSIS — R6510 Systemic inflammatory response syndrome (SIRS) of non-infectious origin without acute organ dysfunction: Secondary | ICD-10-CM

## 2024-04-19 DIAGNOSIS — R531 Weakness: Secondary | ICD-10-CM

## 2024-04-19 DIAGNOSIS — N39490 Overflow incontinence: Secondary | ICD-10-CM | POA: Diagnosis present

## 2024-04-19 DIAGNOSIS — N136 Pyonephrosis: Secondary | ICD-10-CM | POA: Diagnosis present

## 2024-04-19 DIAGNOSIS — E785 Hyperlipidemia, unspecified: Secondary | ICD-10-CM | POA: Diagnosis present

## 2024-04-19 DIAGNOSIS — R7989 Other specified abnormal findings of blood chemistry: Secondary | ICD-10-CM

## 2024-04-19 DIAGNOSIS — Z8249 Family history of ischemic heart disease and other diseases of the circulatory system: Secondary | ICD-10-CM | POA: Diagnosis not present

## 2024-04-19 DIAGNOSIS — E1010 Type 1 diabetes mellitus with ketoacidosis without coma: Secondary | ICD-10-CM | POA: Diagnosis not present

## 2024-04-19 DIAGNOSIS — Z79899 Other long term (current) drug therapy: Secondary | ICD-10-CM | POA: Diagnosis not present

## 2024-04-19 DIAGNOSIS — Y846 Urinary catheterization as the cause of abnormal reaction of the patient, or of later complication, without mention of misadventure at the time of the procedure: Secondary | ICD-10-CM | POA: Diagnosis present

## 2024-04-19 DIAGNOSIS — Z7985 Long-term (current) use of injectable non-insulin antidiabetic drugs: Secondary | ICD-10-CM | POA: Diagnosis not present

## 2024-04-19 DIAGNOSIS — R338 Other retention of urine: Secondary | ICD-10-CM | POA: Diagnosis present

## 2024-04-19 DIAGNOSIS — R6520 Severe sepsis without septic shock: Secondary | ICD-10-CM | POA: Diagnosis present

## 2024-04-19 DIAGNOSIS — A4151 Sepsis due to Escherichia coli [E. coli]: Secondary | ICD-10-CM | POA: Diagnosis present

## 2024-04-19 DIAGNOSIS — E1110 Type 2 diabetes mellitus with ketoacidosis without coma: Secondary | ICD-10-CM | POA: Diagnosis present

## 2024-04-19 DIAGNOSIS — A419 Sepsis, unspecified organism: Secondary | ICD-10-CM | POA: Diagnosis present

## 2024-04-19 DIAGNOSIS — T83518A Infection and inflammatory reaction due to other urinary catheter, initial encounter: Secondary | ICD-10-CM | POA: Diagnosis present

## 2024-04-19 DIAGNOSIS — Z1152 Encounter for screening for COVID-19: Secondary | ICD-10-CM

## 2024-04-19 DIAGNOSIS — I10 Essential (primary) hypertension: Secondary | ICD-10-CM | POA: Diagnosis present

## 2024-04-19 DIAGNOSIS — E871 Hypo-osmolality and hyponatremia: Secondary | ICD-10-CM | POA: Diagnosis present

## 2024-04-19 DIAGNOSIS — N319 Neuromuscular dysfunction of bladder, unspecified: Secondary | ICD-10-CM | POA: Diagnosis present

## 2024-04-19 DIAGNOSIS — Z794 Long term (current) use of insulin: Secondary | ICD-10-CM | POA: Diagnosis not present

## 2024-04-19 DIAGNOSIS — E1165 Type 2 diabetes mellitus with hyperglycemia: Secondary | ICD-10-CM | POA: Diagnosis present

## 2024-04-19 DIAGNOSIS — Z91048 Other nonmedicinal substance allergy status: Secondary | ICD-10-CM | POA: Diagnosis not present

## 2024-04-19 DIAGNOSIS — N179 Acute kidney failure, unspecified: Secondary | ICD-10-CM | POA: Diagnosis present

## 2024-04-19 DIAGNOSIS — N320 Bladder-neck obstruction: Secondary | ICD-10-CM

## 2024-04-19 DIAGNOSIS — N12 Tubulo-interstitial nephritis, not specified as acute or chronic: Principal | ICD-10-CM

## 2024-04-19 DIAGNOSIS — N1330 Unspecified hydronephrosis: Secondary | ICD-10-CM | POA: Diagnosis present

## 2024-04-19 DIAGNOSIS — N3000 Acute cystitis without hematuria: Secondary | ICD-10-CM

## 2024-04-19 LAB — CBC WITH DIFFERENTIAL/PLATELET
Abs Immature Granulocytes: 0.26 10*3/uL — ABNORMAL HIGH (ref 0.00–0.07)
Basophils Absolute: 0.1 10*3/uL (ref 0.0–0.1)
Basophils Relative: 0 %
Eosinophils Absolute: 0 10*3/uL (ref 0.0–0.5)
Eosinophils Relative: 0 %
HCT: 38.9 % (ref 36.0–46.0)
Hemoglobin: 12.3 g/dL (ref 12.0–15.0)
Immature Granulocytes: 2 %
Lymphocytes Relative: 7 %
Lymphs Abs: 0.8 10*3/uL (ref 0.7–4.0)
MCH: 23.7 pg — ABNORMAL LOW (ref 26.0–34.0)
MCHC: 31.6 g/dL (ref 30.0–36.0)
MCV: 75.1 fL — ABNORMAL LOW (ref 80.0–100.0)
Monocytes Absolute: 0.6 10*3/uL (ref 0.1–1.0)
Monocytes Relative: 5 %
Neutro Abs: 9.8 10*3/uL — ABNORMAL HIGH (ref 1.7–7.7)
Neutrophils Relative %: 86 %
Platelets: 407 10*3/uL — ABNORMAL HIGH (ref 150–400)
RBC: 5.18 MIL/uL — ABNORMAL HIGH (ref 3.87–5.11)
RDW: 14.1 % (ref 11.5–15.5)
WBC: 11.5 10*3/uL — ABNORMAL HIGH (ref 4.0–10.5)
nRBC: 0 % (ref 0.0–0.2)

## 2024-04-19 LAB — BLOOD GAS, VENOUS
Acid-base deficit: 6.6 mmol/L — ABNORMAL HIGH (ref 0.0–2.0)
Bicarbonate: 17.2 mmol/L — ABNORMAL LOW (ref 20.0–28.0)
O2 Saturation: 76.4 %
Patient temperature: 37
pCO2, Ven: 29 mmHg — ABNORMAL LOW (ref 44–60)
pH, Ven: 7.38 (ref 7.25–7.43)
pO2, Ven: 45 mmHg (ref 32–45)

## 2024-04-19 LAB — URINALYSIS, W/ REFLEX TO CULTURE (INFECTION SUSPECTED)
Bilirubin Urine: NEGATIVE
Glucose, UA: >=500 mg/dL — AB
Ketones, ur: 5 mg/dL — AB
Nitrite: NEGATIVE
Protein, ur: NEGATIVE mg/dL
Specific Gravity, Urine: 1.012 (ref 1.005–1.030)
WBC, UA: >50 WBC/hpf (ref 0–5)
pH: 5 (ref 5.0–8.0)

## 2024-04-19 LAB — COMPREHENSIVE METABOLIC PANEL WITH GFR
ALT: 26 U/L (ref 0–44)
AST: 18 U/L (ref 15–41)
Albumin: 3.8 g/dL (ref 3.5–5.0)
Alkaline Phosphatase: 135 U/L — ABNORMAL HIGH (ref 38–126)
Anion gap: 19 — ABNORMAL HIGH (ref 5–15)
BUN: 60 mg/dL — ABNORMAL HIGH (ref 8–23)
CO2: 17 mmol/L — ABNORMAL LOW (ref 22–32)
Calcium: 10.1 mg/dL (ref 8.9–10.3)
Chloride: 89 mmol/L — ABNORMAL LOW (ref 98–111)
Creatinine, Ser: 1.66 mg/dL — ABNORMAL HIGH (ref 0.44–1.00)
GFR, Estimated: 34 mL/min — ABNORMAL LOW (ref >60)
Glucose, Bld: 554 mg/dL (ref 70–99)
Potassium: 4.9 mmol/L (ref 3.5–5.1)
Sodium: 125 mmol/L — ABNORMAL LOW (ref 135–145)
Total Bilirubin: 1.3 mg/dL — ABNORMAL HIGH (ref 0.0–1.2)
Total Protein: 10 g/dL — ABNORMAL HIGH (ref 6.5–8.1)

## 2024-04-19 LAB — BASIC METABOLIC PANEL WITH GFR
Anion gap: 15 (ref 5–15)
BUN: 59 mg/dL — ABNORMAL HIGH (ref 8–23)
CO2: 17 mmol/L — ABNORMAL LOW (ref 22–32)
Calcium: 8.9 mg/dL (ref 8.9–10.3)
Chloride: 95 mmol/L — ABNORMAL LOW (ref 98–111)
Creatinine, Ser: 1.64 mg/dL — ABNORMAL HIGH (ref 0.44–1.00)
GFR, Estimated: 35 mL/min — ABNORMAL LOW (ref >60)
Glucose, Bld: 471 mg/dL — ABNORMAL HIGH (ref 70–99)
Potassium: 4.4 mmol/L (ref 3.5–5.1)
Sodium: 127 mmol/L — ABNORMAL LOW (ref 135–145)

## 2024-04-19 LAB — T4, FREE: Free T4: 0.75 ng/dL (ref 0.61–1.12)

## 2024-04-19 LAB — TROPONIN I (HIGH SENSITIVITY)
Troponin I (High Sensitivity): 4 ng/L (ref <18)
Troponin I (High Sensitivity): 6 ng/L (ref <18)

## 2024-04-19 LAB — CBG MONITORING, ED
Glucose-Capillary: 549 mg/dL (ref 70–99)
Glucose-Capillary: 445 mg/dL — ABNORMAL HIGH (ref 70–99)

## 2024-04-19 LAB — LACTIC ACID, PLASMA: Lactic Acid, Venous: 1.4 mmol/L (ref 0.5–1.9)

## 2024-04-19 LAB — RESP PANEL BY RT-PCR (RSV, FLU A&B, COVID)  RVPGX2
Influenza A by PCR: NEGATIVE
Influenza B by PCR: NEGATIVE
Resp Syncytial Virus by PCR: NEGATIVE
SARS Coronavirus 2 by RT PCR: NEGATIVE

## 2024-04-19 LAB — BETA-HYDROXYBUTYRIC ACID
Beta-Hydroxybutyric Acid: 3.6 mmol/L — ABNORMAL HIGH (ref 0.05–0.27)
Beta-Hydroxybutyric Acid: 3.3 mmol/L — ABNORMAL HIGH (ref 0.05–0.27)

## 2024-04-19 LAB — LIPASE, BLOOD: Lipase: 40 U/L (ref 11–51)

## 2024-04-19 LAB — I-STAT CG4 LACTIC ACID, ED: Lactic Acid, Venous: 1.9 mmol/L (ref 0.5–1.9)

## 2024-04-19 LAB — GLUCOSE, CAPILLARY: Glucose-Capillary: 358 mg/dL — ABNORMAL HIGH (ref 70–99)

## 2024-04-19 LAB — BRAIN NATRIURETIC PEPTIDE: B Natriuretic Peptide: 52.6 pg/mL (ref 0.0–100.0)

## 2024-04-19 LAB — TSH: TSH: 3.093 u[IU]/mL (ref 0.350–4.500)

## 2024-04-19 LAB — CK: Total CK: <7 U/L — ABNORMAL LOW (ref 38–234)

## 2024-04-19 LAB — MAGNESIUM: Magnesium: 2.6 mg/dL — ABNORMAL HIGH (ref 1.7–2.4)

## 2024-04-19 MED ORDER — HEPARIN SODIUM (PORCINE) 5000 UNIT/ML IJ SOLN
5000.0000 [IU] | Freq: Three times a day (TID) | INTRAMUSCULAR | Status: DC
  Administered 2024-04-20 – 2024-04-22 (×6): 5000 [IU] via SUBCUTANEOUS
  Filled 2024-04-19 (×7): qty 1

## 2024-04-19 MED ORDER — SODIUM CHLORIDE 0.9 % IV SOLN
2.0000 g | INTRAVENOUS | Status: DC
  Administered 2024-04-20 – 2024-04-21 (×2): 2 g via INTRAVENOUS
  Filled 2024-04-19 (×3): qty 20

## 2024-04-19 MED ORDER — INSULIN REGULAR(HUMAN) IN NACL 100-0.9 UT/100ML-% IV SOLN
INTRAVENOUS | Status: DC
  Administered 2024-04-19: 9.5 [IU]/h via INTRAVENOUS
  Filled 2024-04-19: qty 100

## 2024-04-19 MED ORDER — SODIUM CHLORIDE 0.9 % IV BOLUS
1000.0000 mL | Freq: Once | INTRAVENOUS | Status: AC
  Administered 2024-04-19: 1000 mL via INTRAVENOUS

## 2024-04-19 MED ORDER — IOHEXOL 350 MG/ML SOLN
80.0000 mL | Freq: Once | INTRAVENOUS | Status: AC | PRN
  Administered 2024-04-19: 80 mL via INTRAVENOUS

## 2024-04-19 MED ORDER — LACTATED RINGERS IV SOLN: INTRAVENOUS | Status: AC

## 2024-04-19 MED ORDER — DEXTROSE IN LACTATED RINGERS 5 % IV SOLN: INTRAVENOUS | Status: AC

## 2024-04-19 MED ORDER — INSULIN ASPART 100 UNIT/ML IJ SOLN
6.0000 [IU] | Freq: Once | INTRAMUSCULAR | Status: AC
  Administered 2024-04-19: 6 [IU] via SUBCUTANEOUS
  Filled 2024-04-19: qty 0.06

## 2024-04-19 MED ORDER — SODIUM CHLORIDE 0.9 % IV SOLN
1.0000 g | Freq: Once | INTRAVENOUS | Status: DC
  Administered 2024-04-19: 1 g via INTRAVENOUS
  Filled 2024-04-19: qty 10

## 2024-04-19 MED ORDER — DEXTROSE 50 % IV SOLN: 0.0000 mL | INTRAVENOUS | Status: DC | PRN

## 2024-04-19 NOTE — H&P (Signed)
 History and Physical    Patient: Whitney Mitchell FMW:[id number] DOB: Feb 26, 1961 DOA: 04/19/2024 DOS: the patient was seen and examined on 04/19/2024 PCP: Benjamine Aland, MD  Patient coming from: Home.  Lives with daughter.  Independently ambulates at baseline.  Chief Complaint:  Chief Complaint  Patient presents with   Weakness   Shortness of Breath   HPI: Whitney Mitchell is a 63 y.o. female with PMH of NIDDM-2, HTN, pituitary adenoma and urinary retention presenting with extreme weakness since yesterday.  Patient reports progressive weakness since yesterday.  She had a mild headache earlier this morning that has resolved.  She also reports dysuria and increased frequency of urination for 1 day.  She denies fever but admits to chills.  She has nausea but no emesis.  She reports about 20 pounds weight loss at her last PCP visit last week.  She also reports history of incomplete void for which she was recommended self-catheterization 3 times a day by her PCP about a month ago.  She has been catheterizing self about 2 times a day.  Denies abdominal pain or new back pain.  Denies chest pain, shortness of breath, diarrhea or constipation.  She denies focal neurosymptoms.  Denies vision change.   Patient lives with daughter.  Independently ambulates at baseline.  She denies smoking cigarettes, drinking alcohol recreational drug use.  She is interested in cardiopulmonary resuscitation in event of sudden cardiopulmonary arrest.  In ED, tachycardic to 111.  Other vital stable.  VBG with normal pH but low bicarb to 17.  Na 125 but corrects to normal for hyperglycemia.  Glucose 554.  Bicarb 17.  AG 19.  BHA 3.6.  WBC 11.5.  BNP normal.  Serial troponin negative.  Creatinine 1.66.  BUN 60.  Total protein 10.  BNP and serial troponin negative.  EKG sinus rhythm.  COVID-19, influenza and RSV PCR nonreactive.  UA with pyuria, bacteriuria and glucosuria.  TSH and free T4 normal.  CXR without acute finding.  CT head without  acute finding.  CT chest/abdomen/pelvis showed bladder distention, moderate bilateral hydronephrosis and possible bilateral pyelonephritis.  Cultures obtained.  Received 1 L bolus.  Started on ceftriaxone  and admission requested for UTI with possible pyelo-, generalized weakness and DKA.     Review of Systems: As mentioned in the history of present illness. All other systems reviewed and are negative. Past Medical History:  Diagnosis Date   Diabetes mellitus without complication (HCC)    Hypertension    Past Surgical History:  Procedure Laterality Date   BRAIN SURGERY  2019   Social History:  reports that she has never smoked. She has never used smokeless tobacco. She reports that she does not drink alcohol and does not use drugs.  No Known Allergies  Family History  Problem Relation Age of Onset   Heart attack Father     Prior to Admission medications   Medication Sig Start Date End Date Taking? Authorizing Provider  empagliflozin (JARDIANCE) 25 MG TABS tablet Take 25 mg by mouth daily.    [provider]  irbesartan-hydrochlorothiazide (AVALIDE) 150-12.5 MG tablet Take 1 tablet by mouth daily. 01/20/20   [provider]  Multiple Vitamin (MULTIVITAMIN WITH MINERALS) TABS tablet Take 1 tablet by mouth daily.    [provider]  rosuvastatin (CRESTOR) 20 MG tablet Take 20 mg by mouth daily. 03/10/20   [provider]  Semaglutide , 1 MG/DOSE, (OZEMPIC , 1 MG/DOSE,) 2 MG/1.5ML SOPN Inject 1 mg into the skin once  a week. Sundays 03/27/18   [provider]  Semaglutide , 1 MG/DOSE, (OZEMPIC , 1 MG/DOSE,) 4 MG/3ML SOPN Inject 1 mg into the skin once a week. 12/17/21   Benjamine Aland, MD  Semaglutide , 1 MG/DOSE, (OZEMPIC , 1 MG/DOSE,) 4 MG/3ML SOPN Inject 1 mg into the skin once a week. 04/03/22   Bland, Veita, MD  spironolactone (ALDACTONE) 25 MG tablet Take 25 mg by mouth every morning. 03/10/20   [provider]    Physical Exam: Vitals:    04/19/24 1438 04/19/24 1441 04/19/24 1838  BP: (!) 122/98  113/81  Pulse: (!) 111  (!) 103  Resp: 18  18  Temp: 98 F (36.7 C)  98.5 F (36.9 C)  TempSrc: Oral  Oral  SpO2: 100%  100%  Weight:  63.5 kg   Height:  5' 5 (1.651 m)    GENERAL: No apparent distress.  Nontoxic. HEENT: MMM.  Vision and hearing grossly intact.  NECK: Supple.  No apparent JVD.  RESP:  No IWOB.  Fair aeration bilaterally. CVS:  RRR. Heart sounds normal.  ABD/GI/GU: BS+. Abd soft.  Suprapubic tenderness partly from distended bladder.  No CVA tenderness. MSK/EXT:   No apparent deformity. Moves extremities. No edema.  SKIN: no apparent skin lesion or wound NEURO: Awake and alert. Oriented appropriately.  No apparent focal neuro deficit. PSYCH: Calm. Normal affect.  Data Reviewed: See HPI  Assessment and Plan: Type 2 diabetes with diabetic ketoacidosis: Hyperglycemic to 554 with high anion gap metabolic acidosis.  Her pH is normal but low bicarb to 15.  BHB elevated to 3.6.  Patient is on Jardiance and Ozempic  at home.  Received 1 L of IV fluid bolus in ED. IV fluid and insulin  drip ordered but yet to be started. - Continue IV fluid and insulin  drip per DKA protocol - Labs every 4 hours - N.p.o. except sips with meds - Not a candidate for SGLT2 inhibitors moving forward. - Diabetic coordinator consulted  Severe sepsis due to UTI/possible pyelonephritis: Present on arrival.  She is tachycardic with leukocytosis and AKI.  She has a history of urinary tension for which she has been doing self-catheterization for about a month.  Never seen urology.  UA concerning for UTI.  CT concerning for bilateral pyelonephritis although she has no CVA tenderness or fever. -Foley catheter to be inserted -Follow blood and urine culture -Increase ceftriaxone  to 2 g daily -Check lactic acid -Avoid SGLT2 inhibitors moving forward.  Acute urinary tension with bilateral hydronephrosis - Foley catheter - Needs urology  consultation in the morning  Acute kidney injury: Multifactorial including DKA, sepsis and meds - IV fluid and Foley catheter as above - Avoid nephrotoxic meds  Unintentional weight loss: Suspect this to be due to poorly controlled diabetes and Ozempic .  TSH and free T4 within normal.  CT head, chest, abdomen and pelvis as above -Diabetic control -Consult dietitian - She may have to come off of Ozempic  and Jardiance  Hyponatremia: Likely due to hyperglycemia - Monitor     Advance Care Planning:   Code Status: Full Code-discussed with patient  Consults: None  Family Communication: None at bedside  Severity of Illness: The appropriate patient status for this patient is INPATIENT. Inpatient status is judged to be reasonable and necessary in order to provide the required intensity of service to ensure the patient's safety. The patient's presenting symptoms, physical exam findings, and initial radiographic and laboratory data in the context of their chronic comorbidities is felt to place them  at high risk for further clinical deterioration. Furthermore, it is not anticipated that the patient will be medically stable for discharge from the hospital within 2 midnights of admission.   * I certify that at the point of admission it is my clinical judgment that the patient will require inpatient hospital care spanning beyond 2 midnights from the point of admission due to high intensity of service, high risk for further deterioration and high frequency of surveillance required.*  Author: Elody Kleinsasser T Keion Neels, MD 04/19/2024 9:47 PM  For on call review [email or web address].

## 2024-04-19 NOTE — ED Triage Notes (Signed)
 Patient comes in with family for shortness of breath that is worse today then normal. Decreased appettite and weakness for the last month or so. Dizziness and lightheadedness over the last month and 1/2. Dx of severe depression. Hx of brain tumor 3 years ago.

## 2024-04-19 NOTE — ED Notes (Addendum)
 RN notified about CBG

## 2024-04-19 NOTE — ED Provider Notes (Signed)
 [place] EMERGENCY DEPARTMENT AT [facility] Provider Note   CSN: [id number] Arrival date & time: 04/19/24  1431    Patient presents with: Weakness and Shortness of Breath  Whitney Mitchell is a 63 y.o. female here for multiple complaints. Gen weakness, decreased appetite, emesis, unintentional 20 lb weight loss over the last month and HA.  She has a known pituitary adenoma followed by [facility].  Since that she has had prior surgical resection however they were only able to get half of it due to location.  Feeling unwell over the last month.  States she was seen by PCP and had some blood work done however does not know the results.  She has been sleeping as she has been feeling very weak.  She has no focal weakness, vision changes, neck pain.  She has some generalized shortness of breath, no chest pain, hemoptysis.  She is some generalized upper abdominal pain and some nausea without vomiting.  No changes in her stools.  No recent falls or injuries. Hx of depression. No SI, HI, AVH.   Intermittently straight caths   HPI     Prior to Admission medications   Medication Sig Start Date End Date Taking? Authorizing Provider  empagliflozin (JARDIANCE) 25 MG TABS tablet Take 25 mg by mouth daily.    [provider]  irbesartan-hydrochlorothiazide (AVALIDE) 150-12.5 MG tablet Take 1 tablet by mouth daily. 01/20/20   [provider]  Multiple Vitamin (MULTIVITAMIN WITH MINERALS) TABS tablet Take 1 tablet by mouth daily.    [provider]  rosuvastatin (CRESTOR) 20 MG tablet Take 20 mg by mouth daily. 03/10/20   [provider]  Semaglutide , 1 MG/DOSE, (OZEMPIC , 1 MG/DOSE,) 2 MG/1.5ML SOPN Inject 1 mg into the skin once a week. Sundays 03/27/18   [provider]  Semaglutide , 1 MG/DOSE, (OZEMPIC , 1 MG/DOSE,) 4 MG/3ML SOPN Inject 1 mg into the skin once a week. 12/17/21   Benjamine Aland, MD  Semaglutide , 1 MG/DOSE, (OZEMPIC , 1 MG/DOSE,) 4 MG/3ML SOPN Inject 1  mg into the skin once a week. 04/03/22   Bland, Veita, MD  spironolactone (ALDACTONE) 25 MG tablet Take 25 mg by mouth every morning. 03/10/20   [provider]    Allergies: Patient has no known allergies.    Review of Systems  Constitutional: Negative.   HENT: Negative.    Respiratory:  Positive for shortness of breath.   Cardiovascular: Negative.   Gastrointestinal:  Positive for abdominal pain and nausea. Negative for abdominal distention, anal bleeding, blood in stool, constipation, diarrhea, rectal pain and vomiting.  Genitourinary: Negative.   Musculoskeletal: Negative.   Skin: Negative.   Neurological:  Positive for weakness and headaches.  All other systems reviewed and are negative.   Updated Vital Signs BP (!) 122/98 (BP Location: Left Arm)   Pulse (!) 111   Temp 98 F (36.7 C) (Oral)   Resp 18   Ht 5' 5 (1.651 m)   Wt 63.5 kg   SpO2 100%   BMI 23.30 kg/m   Physical Exam Vitals and nursing note reviewed.  Constitutional:      General: She is not in acute distress.    Appearance: She is well-developed. She is not ill-appearing, toxic-appearing or diaphoretic.  HENT:     Head: Normocephalic and atraumatic.  Eyes:     Pupils: Pupils are equal, round, and reactive to light.  Cardiovascular:     Rate and Rhythm: Normal rate.     Pulses:  Normal pulses.     Heart sounds: Normal heart sounds.  Pulmonary:     Effort: Pulmonary effort is normal. No respiratory distress.     Breath sounds: Normal breath sounds.  Abdominal:     General: Bowel sounds are normal. There is no distension.     Palpations: Abdomen is soft.     Tenderness: There is abdominal tenderness.     Comments: Gen upper abd tenderness, worse to epigastric region, neg murphy, mcburney  Musculoskeletal:        General: Normal range of motion.     Cervical back: Normal range of motion.     Right lower leg: No tenderness. No edema.     Left lower leg: No tenderness. No edema.  Skin:     General: Skin is warm and dry.  Neurological:     General: No focal deficit present.     Mental Status: She is alert.  Psychiatric:        Mood and Affect: Mood normal.     (all labs ordered are listed, but only abnormal results are displayed) Labs Reviewed  RESP PANEL BY RT-PCR (RSV, FLU A&B, COVID)  [id number]  CBC WITH DIFFERENTIAL/PLATELET  COMPREHENSIVE METABOLIC PANEL WITH GFR  LIPASE, BLOOD  URINALYSIS, W/ REFLEX TO CULTURE (INFECTION SUSPECTED)  BRAIN NATRIURETIC PEPTIDE  TSH  T4, FREE  TROPONIN I (HIGH SENSITIVITY)    EKG: EKG Interpretation Date/Time:  Tuesday April 19 2024 14:52:17 EDT Ventricular Rate:  97 PR Interval:  141 QRS Duration:  93 QT Interval:  339 QTC Calculation: 431 R Axis:   37  Text Interpretation: Sinus rhythm Confirmed by Laurice Coy [phone]) on 04/19/2024 3:42:36 PM  Radiology: No results found.   .Critical Care  Performed by: Edie Rosebud LABOR, PA-C Authorized by: Edie Rosebud LABOR, PA-C   Critical care provider statement:    Critical care time (minutes):  35   Critical care was necessary to treat or prevent imminent or life-threatening deterioration of the following conditions:  Endocrine crisis, dehydration and sepsis   Critical care was time spent personally by me on the following activities:  Development of treatment plan with patient or surrogate, discussions with consultants, evaluation of patient's response to treatment, examination of patient, ordering and review of laboratory studies, ordering and review of radiographic studies, ordering and performing treatments and interventions, pulse oximetry, re-evaluation of patient's condition and review of old charts    Medications Ordered in the ED  sodium chloride  0.9 % bolus 1,000 mL (has no administration in time range)   62 year old here for evaluation of multiple complaints.  Weakness been ongoing for the last few weeks.  Decreased appetite, emesis, abdominal pain, shortness of  breath, headache.  History of brain tumor.  Has lost 20 pounds unintentionally over the last few weeks.  Here she is mildly tachycardic, appears to be globally weak.  She has a nonfocal neuroexam.  Will plan on labs and imaging.  Labs and imaging personally viewed interpreted CBC leukocytosis 11.5 Metabolic panel sodium 125, suspect pseudohyponatremia in setting of hyperglycemia glucose 554, creatinine 1.66 prior 0.8, mildly elevated LFTs, anion gap 19>> add on beta hydroxy, osmolality, VBG -------- sodium corrects 132-136 Lipase 40 Troponin 4 TSH 3.093 CT head without acute abnormality, has known tumor CT chest without acute abnormality CT abdomen pelvis with bilateral pyelonephritis, bladder outlet obstruction, bilateral hydro Lactic 1.9  Patient possible early DKA, beta hydroxy 3.6  Discussed with Dr. Kathrin with medicine.  Will start on insulin  drip.  Will be admitted for multiple electrolyte abnormalities as well as bilateral hydronephrosis, DKA.  Meets SIRS criteria.  Blood cultures added on  The patient appears reasonably stabilized for admission considering the current resources, flow, and capabilities available in the ED at this time, and I doubt any other [facility] requiring further screening and/or treatment in the ED prior to admission.   Discussed with patient and family in room.  Agreeable for admission.  Clinical Course as of 04/19/24 2124  Tue Apr 19, 2024  [facility] with medicine, rec insulin  drip [BH]    Clinical Course User Index [BH] Mariaeduarda Defranco A, PA-C                                 Medical Decision Making Amount and/or Complexity of Data Reviewed Independent Historian:     Details: Family At bedside External Data Reviewed: labs, radiology, ECG and notes. Labs: ordered. Decision-making details documented in ED Course. Radiology: ordered and independent interpretation performed. Decision-making details documented in ED Course. ECG/medicine tests: ordered and  independent interpretation performed. Decision-making details documented in ED Course.  Risk OTC drugs. Prescription drug management. Parenteral controlled substances. Decision regarding hospitalization. Diagnosis or treatment significantly limited by social determinants of health.        Final diagnoses:  Pyelonephritis  Diabetic ketoacidosis without coma associated with type 2 diabetes mellitus (HCC)  SIRS (systemic inflammatory response syndrome) (HCC)  Bladder outlet obstruction  AKI (acute kidney injury) (HCC)  Pseudohyponatremia  Weakness    ED Discharge Orders     None          Ashawna Hanback A, PA-C 04/19/24 2219    Laurice Maude BROCKS, MD 04/19/24 2303

## 2024-04-20 DIAGNOSIS — N179 Acute kidney failure, unspecified: Secondary | ICD-10-CM | POA: Diagnosis present

## 2024-04-20 DIAGNOSIS — A419 Sepsis, unspecified organism: Secondary | ICD-10-CM | POA: Diagnosis present

## 2024-04-20 DIAGNOSIS — N1330 Unspecified hydronephrosis: Secondary | ICD-10-CM | POA: Diagnosis present

## 2024-04-20 DIAGNOSIS — R634 Abnormal weight loss: Secondary | ICD-10-CM | POA: Diagnosis present

## 2024-04-20 DIAGNOSIS — E1010 Type 1 diabetes mellitus with ketoacidosis without coma: Secondary | ICD-10-CM

## 2024-04-20 DIAGNOSIS — R338 Other retention of urine: Secondary | ICD-10-CM | POA: Diagnosis present

## 2024-04-20 LAB — OSMOLALITY: Osmolality: 323 mosm/kg (ref 275–295)

## 2024-04-20 LAB — HEMOGLOBIN A1C
Hgb A1c MFr Bld: 14.2 % — ABNORMAL HIGH (ref 4.8–5.6)
Mean Plasma Glucose: 360.84 mg/dL

## 2024-04-20 LAB — BASIC METABOLIC PANEL WITH GFR
Anion gap: 13 (ref 5–15)
Anion gap: 12 (ref 5–15)
BUN: 52 mg/dL — ABNORMAL HIGH (ref 8–23)
BUN: 46 mg/dL — ABNORMAL HIGH (ref 8–23)
CO2: 20 mmol/L — ABNORMAL LOW (ref 22–32)
CO2: 21 mmol/L — ABNORMAL LOW (ref 22–32)
Calcium: 9.8 mg/dL (ref 8.9–10.3)
Calcium: 9.9 mg/dL (ref 8.9–10.3)
Chloride: 100 mmol/L (ref 98–111)
Chloride: 104 mmol/L (ref 98–111)
Creatinine, Ser: 1.29 mg/dL — ABNORMAL HIGH (ref 0.44–1.00)
Creatinine, Ser: 0.98 mg/dL (ref 0.44–1.00)
GFR, Estimated: 46 mL/min — ABNORMAL LOW (ref >60)
GFR, Estimated: >60 mL/min (ref >60)
Glucose, Bld: 195 mg/dL — ABNORMAL HIGH (ref 70–99)
Glucose, Bld: 162 mg/dL — ABNORMAL HIGH (ref 70–99)
Potassium: 3.9 mmol/L (ref 3.5–5.1)
Potassium: 4 mmol/L (ref 3.5–5.1)
Sodium: 133 mmol/L — ABNORMAL LOW (ref 135–145)
Sodium: 137 mmol/L (ref 135–145)

## 2024-04-20 LAB — LACTIC ACID, PLASMA: Lactic Acid, Venous: 2.2 mmol/L (ref 0.5–1.9)

## 2024-04-20 LAB — BLOOD CULTURE ID PANEL (REFLEXED) - BCID2

## 2024-04-20 LAB — BETA-HYDROXYBUTYRIC ACID
Beta-Hydroxybutyric Acid: 0.41 mmol/L — ABNORMAL HIGH (ref 0.05–0.27)
Beta-Hydroxybutyric Acid: 0.09 mmol/L (ref 0.05–0.27)

## 2024-04-20 LAB — GLUCOSE, CAPILLARY
Glucose-Capillary: 137 mg/dL — ABNORMAL HIGH (ref 70–99)
Glucose-Capillary: 155 mg/dL — ABNORMAL HIGH (ref 70–99)
Glucose-Capillary: 157 mg/dL — ABNORMAL HIGH (ref 70–99)
Glucose-Capillary: 166 mg/dL — ABNORMAL HIGH (ref 70–99)
Glucose-Capillary: 171 mg/dL — ABNORMAL HIGH (ref 70–99)
Glucose-Capillary: 186 mg/dL — ABNORMAL HIGH (ref 70–99)
Glucose-Capillary: 187 mg/dL — ABNORMAL HIGH (ref 70–99)
Glucose-Capillary: 189 mg/dL — ABNORMAL HIGH (ref 70–99)
Glucose-Capillary: 199 mg/dL — ABNORMAL HIGH (ref 70–99)
Glucose-Capillary: 207 mg/dL — ABNORMAL HIGH (ref 70–99)
Glucose-Capillary: 217 mg/dL — ABNORMAL HIGH (ref 70–99)
Glucose-Capillary: 244 mg/dL — ABNORMAL HIGH (ref 70–99)
Glucose-Capillary: 263 mg/dL — ABNORMAL HIGH (ref 70–99)
Glucose-Capillary: 288 mg/dL — ABNORMAL HIGH (ref 70–99)

## 2024-04-20 LAB — URINE CULTURE

## 2024-04-20 LAB — HIV ANTIBODY (ROUTINE TESTING W REFLEX): HIV Screen 4th Generation wRfx: NONREACTIVE

## 2024-04-20 LAB — MAGNESIUM
Magnesium: 2.7 mg/dL — ABNORMAL HIGH (ref 1.7–2.4)
Magnesium: 2.6 mg/dL — ABNORMAL HIGH (ref 1.7–2.4)

## 2024-04-20 MED ORDER — LACTATED RINGERS IV BOLUS
250.0000 mL | Freq: Once | INTRAVENOUS | Status: AC
  Administered 2024-04-20: 250 mL via INTRAVENOUS

## 2024-04-20 MED ORDER — INSULIN ASPART 100 UNIT/ML IJ SOLN
3.0000 [IU] | Freq: Three times a day (TID) | INTRAMUSCULAR | Status: DC
  Administered 2024-04-20 – 2024-04-22 (×7): 3 [IU] via SUBCUTANEOUS

## 2024-04-20 MED ORDER — INSULIN GLARGINE-YFGN 100 UNIT/ML ~~LOC~~ SOLN
10.0000 [IU] | SUBCUTANEOUS | Status: DC
  Administered 2024-04-20 – 2024-04-22 (×3): 10 [IU] via SUBCUTANEOUS
  Filled 2024-04-20 (×4): qty 0.1

## 2024-04-20 MED ORDER — FENTANYL CITRATE PF 50 MCG/ML IJ SOSY
12.5000 ug | PREFILLED_SYRINGE | Freq: Once | INTRAMUSCULAR | Status: AC
  Administered 2024-04-20: 12.5 ug via INTRAVENOUS
  Filled 2024-04-20: qty 1

## 2024-04-20 MED ORDER — ROSUVASTATIN CALCIUM 20 MG PO TABS
20.0000 mg | ORAL_TABLET | Freq: Every day | ORAL | Status: DC
  Administered 2024-04-20 – 2024-04-22 (×3): 20 mg via ORAL
  Filled 2024-04-20 (×3): qty 1

## 2024-04-20 MED ORDER — DOCUSATE SODIUM 100 MG PO CAPS
100.0000 mg | ORAL_CAPSULE | Freq: Two times a day (BID) | ORAL | Status: DC
  Administered 2024-04-20 – 2024-04-22 (×5): 100 mg via ORAL
  Filled 2024-04-20 (×5): qty 1

## 2024-04-20 MED ORDER — INSULIN ASPART 100 UNIT/ML IJ SOLN
0.0000 [IU] | Freq: Every day | INTRAMUSCULAR | Status: DC
  Administered 2024-04-20: 2 [IU] via SUBCUTANEOUS
  Administered 2024-04-21: 3 [IU] via SUBCUTANEOUS

## 2024-04-20 MED ORDER — ORAL CARE MOUTH RINSE: 15.0000 mL | OROMUCOSAL | Status: DC | PRN

## 2024-04-20 MED ORDER — CHLORHEXIDINE GLUCONATE CLOTH 2 % EX PADS
6.0000 | MEDICATED_PAD | Freq: Every day | CUTANEOUS | Status: DC
  Administered 2024-04-21: 6 via TOPICAL

## 2024-04-20 MED ORDER — INSULIN ASPART 100 UNIT/ML IJ SOLN
0.0000 [IU] | Freq: Three times a day (TID) | INTRAMUSCULAR | Status: DC
  Administered 2024-04-20: 8 [IU] via SUBCUTANEOUS
  Administered 2024-04-20 – 2024-04-21 (×2): 3 [IU] via SUBCUTANEOUS
  Administered 2024-04-21 – 2024-04-22 (×3): 5 [IU] via SUBCUTANEOUS
  Administered 2024-04-22: 8 [IU] via SUBCUTANEOUS

## 2024-04-20 MED ORDER — ENSURE PLUS HIGH PROTEIN PO LIQD
237.0000 mL | Freq: Two times a day (BID) | ORAL | Status: DC
  Administered 2024-04-21 – 2024-04-22 (×2): 237 mL via ORAL

## 2024-04-20 NOTE — Progress Notes (Signed)
 Progress Note   Patient: Whitney FUJII FMW:[id number] DOB: [birth year] DOA: 04/19/2024     1 DOS: the patient was seen and examined on 04/20/2024   Brief hospital course: 63yo with h/o DM, HTN, and urinary retention on self-caths who presented on 7/1 with generalized weakness.  She was found to be in DKA and was started on Endotool. Also with severe sepsis due to UTI; CT with B pyelonephritis, started on Ceftriaxone .    Assessment and Plan:  Type 2 diabetes with diabetic ketoacidosis Hyperglycemic to 554 with high anion gap metabolic acidosis and bicarb to 15   BHB elevated to 3.6   Patient is on Jardiance and Ozempic  at home Started on Endotool with resolution of DKA  Not a candidate for SGLT2 inhibitors moving forward Diabetic coordinator consulted A1c is 14.2, very poor baseline control Will start basal/bolus insulin  and she is likely to need this at home   Severe sepsis due to UTI/possible pyelonephritis Tachycardia with tachypnea, leukocytosis, and AKI She has a history of urinary retension for which she has been doing self-catheterization for about a month UA concerning for UTI CT concerning for bilateral pyelonephritis although she has no CVA tenderness or fever Will need ongoing Foley catheter  Follow blood and urine cultures Continue ceftriaxone   Avoid SGLT2 inhibitors moving forward   Acute urinary retention with bilateral hydronephrosis Foley catheter inserted Urology is consulting Urology recommends resumption of I/O cath at home and dc of foley just prior to dc Outpatient urodynamic studies   Acute kidney injury Multifactorial including DKA, sepsis and meds IV fluid and Foley catheter as above Avoid nephrotoxic meds   Unintentional weight loss Suspect this to be due to poorly controlled diabetes and Ozempic  TSH and free T4 within normal. Diabetic control Consult dietitian She may have to come off of Ozempic  as well as Jardiance         Consultants: Urology  Procedures: None  Antibiotics: Ceftriaxone  7/1-  30 Day Unplanned Readmission Risk Score    Flowsheet Row ED to Hosp-Admission (Current) from 04/19/2024 in [facility]-ICU/STEPDOWN  30 Day Unplanned Readmission Risk Score (%) 9.53 Filed at 04/20/2024 0401    This score is the patient's risk of an unplanned readmission within 30 days of being discharged (0 -100%). The score is based on dignosis, age, lab data, medications, orders, and past utilization.   Low:  0-14.9   Medium: 15-21.9   High: 22-29.9   Extreme: 30 and above           Subjective: Feeling better.  Wants to go home soon.  Recognizes that she has not been as focused on taking care of herself as needed.  Still ongoing suprapubic pain.   Objective: Vitals:   04/20/24 1400 04/20/24 1551  BP: (!) 157/87 121/85  Pulse: (!) 115 90  Resp: 14 15  Temp:  98.5 F (36.9 C)  SpO2: 100% 100%    Intake/Output Summary (Last 24 hours) at 04/20/2024 1647 Last data filed at 04/20/2024 1457 Gross per 24 hour  Intake 2067.09 ml  Output 3750 ml  Net -1682.91 ml   Filed Weights   04/19/24 1441 04/19/24 2245  Weight: 63.5 kg 59.8 kg    Exam:  General:  Appears calm and comfortable and is in NAD Eyes:  normal lids, iris ENT:  grossly normal hearing, lips & tongue, mmm Cardiovascular:  RRR, no m/r/g. No LE edema.  Respiratory:   CTA bilaterally with no wheezes/rales/rhonchi.  Normal respiratory effort. Abdomen:  soft, suprapubic TTP, ND Back:   normal alignment, no CVAT Skin:  no rash or induration seen on limited exam Musculoskeletal:  grossly normal tone BUE/BLE, good ROM, no bony abnormality Psychiatric:  grossly normal mood and affect, speech fluent and appropriate, AOx3 Neurologic:  CN 2-12 grossly intact, moves all extremities in coordinated fashion  Data Reviewed: I have reviewed the patient's lab results since admission.  Pertinent labs for today  include:   Na++ 133, improving CO2 20, improving Glucose 195 BUN 52/Creatinine 1.29/GFR 46, improving Lactate 1.4, 2.2 Beta-hydroxybutyrate 0.41, improving A1c 14.2     Family Communication: None present  Disposition: Status is: Inpatient Remains inpatient appropriate because: ongoing management     Time spent: 50 minutes  Unresulted Labs (From admission, onward)     Start     Ordered   04/21/24 0500  Basic metabolic panel  Daily,   R     Question:  Specimen collection method  Answer:  Lab=Lab collect   04/20/24 0916   04/21/24 0500  CBC with Differential/Platelet  Tomorrow morning,   R       Question:  Specimen collection method  Answer:  Lab=Lab collect   04/20/24 1647   04/20/24 1147  MRSA Next Gen by PCR, Nasal  Once,   R        04/20/24 1146             Author: Delon Herald, MD 04/20/2024 4:47 PM  For on call review [email or web address].

## 2024-04-20 NOTE — Consult Note (Signed)
 Urology Consult Note   Requesting Attending Physician:  Barbarann Nest, MD Service Providing Consult: Urology  Consulting Attending: Dr. Gaston   Reason for Consult:    HPI: Whitney Mitchell is seen in consultation for reasons noted above at the request of Barbarann Nest, MD. Patient presents to [facility] emergency department with concern over progressive weakness since yesterday.  PMH significant for undifferentiated urinary retention for which she is in and out cathing twice a day at the direction of her primary care provider, T2DM, HTN, and pituitary adenoma.  In emergency department patient was found to have a glucose of 554, anion gap of 19, creatinine of 1.66, and evidence of UTI.  CT A/P noted notably distended bladder with moderate bilateral hydroureteronephrosis.  [facility] was consulted to speak to these findings.  Patient was resting comfortably in bed on rounds.  She was alert, oriented, and in no distress.  She was very pleasant and a good historian.  She is known by our practice and has been followed for the past year, previously by Dr. Keneth and now by Dr. Elisabeth. she has undergone urodynamic studies and was most recently seen by Dr. Elisabeth.  She was recently trained on clean intermittent catheterization which she is supposed to do 2-3 times a day.  She was trained on 3/31 but reports that she has only been performing CIC for about a week.  She finds it uncomfortable so she has only been averaging 2 times per day.  She reports that she has overflow incontinence at work without sensation and that it worsens if she tries to run to the bathroom.  ------------------  Assessment:   63 y.o. female with DKA, neurogenic bladder, and bilateral hydronephrosis   Recommendations: # Neurogenic bladder # UTI #AKI- resolved  Recently trained on CIC.  If she is being forthcoming about how well she has kept up with this, then her 2-3 xd catheterization should probably be 3's-4 xd  on discharge.  She reports that she finds it uncomfortable and reports no sensation of bladder distention, so I question how much she has been catheterizing.  Regardless, CIC supplies are not available in hospital.  Recommend keep Foley catheter in place, remove foley just before discharge, at which time patient will resume CIC at home with slightly more frequency.  Urinalysis consistent with urinary tract infection.  Agree with empiric ABX.  UCx pending.  Most recent culture data in office was only positive for yeast-12/02/2023.  Trend labs.  Combination of obstructive uropathy and dehydration.  SCR 1.64 on arrival, now 0.98.  Urology will follow peripherally  Case and plan discussed with Dr. Gaston  Past Medical History: Past Medical History:  Diagnosis Date   Diabetes mellitus without complication (HCC)    Hypertension     Past Surgical History:  Past Surgical History:  Procedure Laterality Date   BRAIN SURGERY  2019    Medication: Current Facility-Administered Medications  Medication Dose Route Frequency Provider Last Rate Last Admin   cefTRIAXone  (ROCEPHIN ) 2 g in sodium chloride  0.9 % 100 mL IVPB  2 g Intravenous Q24H Gonfa, Taye T, MD       dextrose  5 % in lactated ringers infusion   Intravenous Continuous Gonfa, Taye T, MD 125 mL/hr at 04/20/24 0924 New Bag at 04/20/24 0924   dextrose  50 % solution 0-50 mL  0-50 mL Intravenous PRN Gonfa, Taye T, MD       docusate sodium (COLACE) capsule 100 mg  100 mg Oral  BID Barbarann Nest, MD   100 mg at 04/20/24 9075   heparin injection 5,000 Units  5,000 Units Subcutaneous Q8H Gonfa, Taye T, MD       insulin  aspart (novoLOG) injection 0-15 Units  0-15 Units Subcutaneous TID WC Barbarann Nest, MD       insulin  aspart (novoLOG) injection 0-5 Units  0-5 Units Subcutaneous QHS Barbarann Nest, MD       insulin  aspart (novoLOG) injection 3 Units  3 Units Subcutaneous TID WC Barbarann Nest, MD       insulin  glargine-yfgn [facility])  injection 10 Units  10 Units Subcutaneous Q24H Barbarann Nest, MD   10 Units at 04/20/24 0957   insulin  regular, human (MYXREDLIN) 100 units/ 100 mL infusion   Intravenous Continuous Gonfa, Taye T, MD 4.2 mL/hr at 04/20/24 0723 4.2 Units/hr at 04/20/24 9276   lactated ringers infusion   Intravenous Continuous Gonfa, Taye T, MD   Stopped at 04/20/24 0106   rosuvastatin (CRESTOR) tablet 20 mg  20 mg Oral Daily Barbarann Nest, MD   20 mg at 04/20/24 9075    Allergies: No Known Allergies  Social History: Social History   Tobacco Use   Smoking status: Never   Smokeless tobacco: Never  Vaping Use   Vaping status: Never Used  Substance Use Topics   Alcohol use: No   Drug use: No    Family History Family History  Problem Relation Age of Onset   Heart attack Father     Review of Systems  Genitourinary:  Positive for dysuria, frequency and urgency. Negative for flank pain and hematuria.     Objective   Vital signs in last 24 hours: BP 139/84   Pulse 89   Temp 97.8 F (36.6 C) (Oral)   Resp 15   Ht 5' 3 (1.6 m)   Wt 59.8 kg   SpO2 99%   BMI 23.35 kg/m   Physical Exam General: A&O, resting, appropriate HEENT: [place]/AT Pulmonary: Normal work of breathing Cardiovascular: no cyanosis Abdomen: Soft, NTTP, nondistended GU: foley in place draining clear yellow urine Neuro: Appropriate, no focal neurological deficits  Most Recent Labs: Lab Results  Component Value Date   WBC 11.5 (H) 04/19/2024   HGB 12.3 04/19/2024   HCT 38.9 04/19/2024   PLT 407 (H) 04/19/2024    Lab Results  Component Value Date   NA 137 04/20/2024   K 4.0 04/20/2024   CL 104 04/20/2024   CO2 21 (L) 04/20/2024   BUN 46 (H) 04/20/2024   CREATININE 0.98 04/20/2024   CALCIUM 9.9 04/20/2024   MG 2.6 (H) 04/20/2024    No results found for: INR, APTT   Urine Culture: @[id number] (laburin,org,[id number])@   IMAGING: CT Angio Chest PE W and/or Wo Contrast Result Date: 04/19/2024 CLINICAL  DATA:  Shortness of breath abdominal pain.  Weight loss. EXAM: CT ANGIOGRAPHY CHEST CT ABDOMEN AND PELVIS WITH CONTRAST TECHNIQUE: Multidetector CT imaging of the chest was performed using the standard protocol during bolus administration of intravenous contrast. Multiplanar CT image reconstructions and MIPs were obtained to evaluate the vascular anatomy. Multidetector CT imaging of the abdomen and pelvis was performed using the standard protocol during bolus administration of intravenous contrast. RADIATION DOSE REDUCTION: This exam was performed according to the departmental dose-optimization program which includes automated exposure control, adjustment of the mA and/or kV according to patient size and/or use of iterative reconstruction technique. CONTRAST:  80mL OMNIPAQUE  IOHEXOL  350 MG/ML SOLN COMPARISON:  CT abdomen pelvis dated 04/10/2020. FINDINGS: CTA  CHEST FINDINGS Cardiovascular: There is no cardiomegaly. Small pericardial effusion measures 6 mm thickness anterior to the heart. The thoracic aorta is unremarkable. The origins of the great vessels of the aortic arch are patent. No pulmonary artery embolus identified. Mediastinum/Nodes: No hilar or mediastinal adenopathy. Small hiatal hernia. The esophagus is grossly unremarkable. No mediastinal fluid collection. Lungs/Pleura: Linear and streaky atelectasis/scarring in the right middle and right lower lobe. No consolidative changes. There is no pleural effusion pneumothorax. The central airways are patent. Musculoskeletal: Degenerative changes of the spine. No acute osseous pathology. Review of the MIP images confirms the above findings. CT ABDOMEN and PELVIS FINDINGS No intra-abdominal free air or free fluid. Hepatobiliary: The liver is unremarkable. There is mild dilatation of the left intrahepatic biliary trees. The gallbladder is unremarkable. Pancreas: Unremarkable. No pancreatic ductal dilatation or surrounding inflammatory changes. Spleen: Normal in  size without focal abnormality. Adrenals/Urinary Tract: The adrenal glands are unremarkable. There is moderate bilateral hydronephrosis. There is heterogeneous nephrogram bilaterally which may represent congestive changes or pyelonephritis. Correlation with urinalysis recommended. Moderate bilateral hydroureter to the level of the urinary bladder. No obstructing stone. The urinary bladder is moderately distended. Stomach/Bowel: There is moderate stool throughout the colon. There is no bowel obstruction or active inflammation. The appendix is not visualized with certainty. No inflammatory changes identified in the right lower quadrant. Vascular/Lymphatic: The abdominal aorta and IVC are unremarkable. No portal venous gas. There is no adenopathy. Reproductive: The uterus is grossly unremarkable. No suspicious adnexal masses. Other: None Musculoskeletal: Degenerative changes spine. No acute osseous pathology. Review of the MIP images confirms the above findings. IMPRESSION: 1. No acute intrathoracic pathology. No CT evidence of pulmonary artery embolus. 2. Moderate distension of the urinary bladder may be related to bladder outlet obstruction. Urology consult is advised. 3. There is moderate bilateral hydronephrosis and hydroureter to the level of the urinary bladder. No obstructing stone. 4. Heterogeneous nephrogram bilaterally may represent pyelonephritis. Correlation with urinalysis recommended. 5. No bowel obstruction. Electronically Signed   By: Vanetta Chou M.D.   On: 04/19/2024 20:25   CT ABDOMEN PELVIS W CONTRAST Result Date: 04/19/2024 CLINICAL DATA:  Shortness of breath abdominal pain.  Weight loss. EXAM: CT ANGIOGRAPHY CHEST CT ABDOMEN AND PELVIS WITH CONTRAST TECHNIQUE: Multidetector CT imaging of the chest was performed using the standard protocol during bolus administration of intravenous contrast. Multiplanar CT image reconstructions and MIPs were obtained to evaluate the vascular anatomy.  Multidetector CT imaging of the abdomen and pelvis was performed using the standard protocol during bolus administration of intravenous contrast. RADIATION DOSE REDUCTION: This exam was performed according to the departmental dose-optimization program which includes automated exposure control, adjustment of the mA and/or kV according to patient size and/or use of iterative reconstruction technique. CONTRAST:  80mL OMNIPAQUE  IOHEXOL  350 MG/ML SOLN COMPARISON:  CT abdomen pelvis dated 04/10/2020. FINDINGS: CTA CHEST FINDINGS Cardiovascular: There is no cardiomegaly. Small pericardial effusion measures 6 mm thickness anterior to the heart. The thoracic aorta is unremarkable. The origins of the great vessels of the aortic arch are patent. No pulmonary artery embolus identified. Mediastinum/Nodes: No hilar or mediastinal adenopathy. Small hiatal hernia. The esophagus is grossly unremarkable. No mediastinal fluid collection. Lungs/Pleura: Linear and streaky atelectasis/scarring in the right middle and right lower lobe. No consolidative changes. There is no pleural effusion pneumothorax. The central airways are patent. Musculoskeletal: Degenerative changes of the spine. No acute osseous pathology. Review of the MIP images confirms the above findings. CT ABDOMEN and PELVIS FINDINGS No  intra-abdominal free air or free fluid. Hepatobiliary: The liver is unremarkable. There is mild dilatation of the left intrahepatic biliary trees. The gallbladder is unremarkable. Pancreas: Unremarkable. No pancreatic ductal dilatation or surrounding inflammatory changes. Spleen: Normal in size without focal abnormality. Adrenals/Urinary Tract: The adrenal glands are unremarkable. There is moderate bilateral hydronephrosis. There is heterogeneous nephrogram bilaterally which may represent congestive changes or pyelonephritis. Correlation with urinalysis recommended. Moderate bilateral hydroureter to the level of the urinary bladder. No  obstructing stone. The urinary bladder is moderately distended. Stomach/Bowel: There is moderate stool throughout the colon. There is no bowel obstruction or active inflammation. The appendix is not visualized with certainty. No inflammatory changes identified in the right lower quadrant. Vascular/Lymphatic: The abdominal aorta and IVC are unremarkable. No portal venous gas. There is no adenopathy. Reproductive: The uterus is grossly unremarkable. No suspicious adnexal masses. Other: None Musculoskeletal: Degenerative changes spine. No acute osseous pathology. Review of the MIP images confirms the above findings. IMPRESSION: 1. No acute intrathoracic pathology. No CT evidence of pulmonary artery embolus. 2. Moderate distension of the urinary bladder may be related to bladder outlet obstruction. Urology consult is advised. 3. There is moderate bilateral hydronephrosis and hydroureter to the level of the urinary bladder. No obstructing stone. 4. Heterogeneous nephrogram bilaterally may represent pyelonephritis. Correlation with urinalysis recommended. 5. No bowel obstruction. Electronically Signed   By: Vanetta Chou M.D.   On: 04/19/2024 20:25   CT Head Wo Contrast Result Date: 04/19/2024 CLINICAL DATA:  Headache, history of brain tumor, shortness of breath EXAM: CT HEAD WITHOUT CONTRAST TECHNIQUE: Contiguous axial images were obtained from the base of the skull through the vertex without intravenous contrast. RADIATION DOSE REDUCTION: This exam was performed according to the departmental dose-optimization program which includes automated exposure control, adjustment of the mA and/or kV according to patient size and/or use of iterative reconstruction technique. COMPARISON:  None are available FINDINGS: Brain: No intracranial hemorrhage, mass effect, or evidence of acute infarct. No hydrocephalus. No extra-axial fluid collection. Chronic infarct versus prominent perivascular space in the left basal ganglia.  Question pituitary mass versus postoperative change about the sella. Vascular: No hyperdense vessel or unexpected calcification. Skull: No fracture or focal lesion. Sinuses/Orbits: No acute finding. Other: None. IMPRESSION: 1. No acute intracranial abnormality. 2. Question pituitary mass versus postoperative change about the sella. Comparison with prior imaging and history is recommended. Electronically Signed   By: Norman Gatlin M.D.   On: 04/19/2024 20:16   DG Chest 2 View Result Date: 04/19/2024 CLINICAL DATA:  sob EXAM: CHEST - 2 VIEW COMPARISON:  None available. FINDINGS: Subsegmental atelectasis in the right lung base. No focal airspace consolidation, pleural effusion, or pneumothorax. No cardiomegaly.No acute fracture or destructive lesion. Multilevel thoracic osteophytosis. IMPRESSION: No acute cardiopulmonary abnormality. Electronically Signed   By: Rogelia Myers M.D.   On: 04/19/2024 17:10    ------  Ole Bourdon, NP Pager: [phone]   Please contact the urology consult pager with any further questions/concerns.

## 2024-04-20 NOTE — Inpatient Diabetes Management (Signed)
 Inpatient Diabetes Program Recommendations  AACE/ADA: New Consensus Statement on Inpatient Glycemic Control (2015)  Target Ranges:  Prepandial:   less than 140 mg/dL      Peak postprandial:   less than 180 mg/dL (1-2 hours)      Critically ill patients:  140 - 180 mg/dL   Lab Results  Component Value Date   GLUCAP 166 (H) 04/20/2024   HGBA1C 14.2 (H) 04/19/2024    Review of Glycemic Control  Diabetes history: DM2 Outpatient Diabetes medications: Jardiance 25 mg daily, Lantus 6 units QAM Current orders for Inpatient glycemic control: Semglee 10 every day, Novolog 0-15 TID with meals and 0-5 HS + 3 units TID  HgbA1C - 14.2%  Inpatient Diabetes Program Recommendations:   Agree with orders.  Spoke with pt at bedside regarding her diabetes and HgbA1C of 14.2%. Pt states she's been eating a lot of foods that she knows she should limit. Has been on Lantus for about a month. Has appt with PCP this Thursday for f/u. Checks blood sugars at least 3x/day and noticed they were going higher and higher. Gives insulin  in abdomen. 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 Discussed SGLT-2 and discontinuing this at home. Also discussed adding Novolog s/s + meal coverage. Pt states she's fine with adding rapid-acting insulin . States she knows how important it is to get HgbA1C down to < 8%.  xplained how hyperglycemia leads to damage within blood vessels which lead to the common complications seen with uncontrolled diabetes. Stressed to the patient the importance of improving glycemic control to prevent further complications from uncontrolled diabetes. Discussed impact of nutrition, exercise, stress, sickness, and medications on diabetes control.  Discussed carbohydrates, carbohydrate goals per day and meal, along with portion sizes. Pt appreciative of visit.  Continue to follow while inpatient.   Thank you. Shona Brandy, RD, LDN, [facility] Inpatient Diabetes  Coordinator [phone]

## 2024-04-20 NOTE — Progress Notes (Signed)
 PHARMACY - PHYSICIAN COMMUNICATION CRITICAL VALUE ALERT - BLOOD CULTURE IDENTIFICATION (BCID)  Whitney Mitchell is an 63 y.o. female who presented to [facility] on 04/19/2024 with a chief complaint of weakness  Assessment:  1/4 bottles GNR = E Coli, no R. Suspected source: urine.   Name of physician (or Provider) Contacted: A. Andrez  Current antibiotics: ceftriaxone  2 gm IV q24  Changes to prescribed antibiotics recommended:  Patient is on recommended antibiotics - No changes needed  Results for orders placed or performed during the hospital encounter of 04/19/24  Blood Culture ID Panel (Reflexed) (Collected: 04/19/2024  9:09 PM)  Result Value Ref Range   Enterococcus faecalis NOT DETECTED NOT DETECTED   Enterococcus Faecium NOT DETECTED NOT DETECTED   Listeria monocytogenes NOT DETECTED NOT DETECTED   Staphylococcus species NOT DETECTED NOT DETECTED   Staphylococcus aureus (BCID) NOT DETECTED NOT DETECTED   Staphylococcus epidermidis NOT DETECTED NOT DETECTED   Staphylococcus lugdunensis NOT DETECTED NOT DETECTED   Streptococcus species NOT DETECTED NOT DETECTED   Streptococcus agalactiae NOT DETECTED NOT DETECTED   Streptococcus pneumoniae NOT DETECTED NOT DETECTED   Streptococcus pyogenes NOT DETECTED NOT DETECTED   A.calcoaceticus-baumannii NOT DETECTED NOT DETECTED   Bacteroides fragilis NOT DETECTED NOT DETECTED   Enterobacterales DETECTED (A) NOT DETECTED   Enterobacter cloacae complex NOT DETECTED NOT DETECTED   Escherichia coli DETECTED (A) NOT DETECTED   Klebsiella aerogenes NOT DETECTED NOT DETECTED   Klebsiella oxytoca NOT DETECTED NOT DETECTED   Klebsiella pneumoniae NOT DETECTED NOT DETECTED   Proteus species NOT DETECTED NOT DETECTED   Salmonella species NOT DETECTED NOT DETECTED   Serratia marcescens NOT DETECTED NOT DETECTED   Haemophilus influenzae NOT DETECTED NOT DETECTED   Neisseria meningitidis NOT DETECTED NOT DETECTED   Pseudomonas aeruginosa NOT DETECTED NOT  DETECTED   Stenotrophomonas maltophilia NOT DETECTED NOT DETECTED   Candida albicans NOT DETECTED NOT DETECTED   Candida auris NOT DETECTED NOT DETECTED   Candida glabrata NOT DETECTED NOT DETECTED   Candida krusei NOT DETECTED NOT DETECTED   Candida parapsilosis NOT DETECTED NOT DETECTED   Candida tropicalis NOT DETECTED NOT DETECTED   Cryptococcus neoformans/gattii NOT DETECTED NOT DETECTED   CTX-M ESBL NOT DETECTED NOT DETECTED   Carbapenem resistance IMP NOT DETECTED NOT DETECTED   Carbapenem resistance KPC NOT DETECTED NOT DETECTED   Carbapenem resistance NDM NOT DETECTED NOT DETECTED   Carbapenem resist OXA 48 LIKE NOT DETECTED NOT DETECTED   Carbapenem resistance VIM NOT DETECTED NOT DETECTED    Rosaline IVAR Edison, Pharm.D Use secure chat for questions 04/20/2024 8:12 PM

## 2024-04-20 NOTE — Hospital Course (Signed)
 63yo with h/o DM, HTN, and urinary retention on self-caths who presented on 7/1 with generalized weakness.  She was found to be in DKA and was started on Endotool. Also with severe sepsis due to UTI; CT with B pyelonephritis, started on Ceftriaxone .

## 2024-04-20 NOTE — Evaluation (Signed)
 Occupational Therapy Evaluation Patient Details Name: Whitney Mitchell MRN: [id number] DOB: [birth year] Today's Date: 04/20/2024   History of Present Illness   63 yr old female presents to therapy following hospital admission on 04/19/2024 due to weakness and dysuria. Pt found to have diabetic ketoacidosis, sepsis secondary to UTI with possible pyelonephritis and AKI.  Pt PMH includes but is not limited to: DM II, HTN, pituitary adenoma s/p surgery in 2019 and urinary retention.     Clinical Impressions The pt is currently presenting the below listed deficits (see OT problem list). During the session, she required supervision for supine to sit and CGA for lower body dressing & sit to stand. She further needed CGA to min assist for dynamic standing balance, as she ambulated into the hall. She reported feelings of generalized weakness and dizziness with activity. Her blood pressure was taken and noted to be as follows: 148/80 seated EOB and 126/85 in standing. OT anticipates she will do well functionally from receiving further therapy services in the acute care setting. OT further anticipates she will not require post-acute care therapy services and she will be okay to return with her family at discharge.      If plan is discharge home, recommend the following:   Direct supervision/assist for medications management;Help with stairs or ramp for entrance     Functional Status Assessment   Patient has had a recent decline in their functional status and demonstrates the ability to make significant improvements in function in a reasonable and predictable amount of time.     Equipment Recommendations   None recommended by OT     Recommendations for Other Services         Precautions/Restrictions   Precautions Precautions: Fall Restrictions Weight Bearing Restrictions Per Provider Order: No Other Position/Activity Restrictions: monitor blood pressure     Mobility Bed Mobility Overal  bed mobility: Needs Assistance Bed Mobility: Supine to Sit     Supine to sit: Supervision, HOB elevated          Transfers Overall transfer level: Needs assistance Equipment used: Rolling walker (2 wheels), 1 person hand held assist Transfers: Sit to/from Stand Sit to Stand: Contact guard assist         Balance Overall balance assessment: Mild deficits observed, not formally tested         ADL either performed or assessed with clinical judgement   ADL Overall ADL's : Needs assistance/impaired Eating/Feeding: Independent;Sitting   Grooming: Set up;Contact guard assist Grooming Details (indicate cue type and reason): Set-up seated or CGA standing         Upper Body Dressing : Set up;Sitting   Lower Body Dressing: Contact guard assist;Sit to/from stand   Toilet Transfer: Contact guard assist;Ambulation Toilet Transfer Details (indicate cue type and reason): at bathroom level, based on clinical judgement Toileting- Clothing Manipulation and Hygiene: Contact guard assist;Sit to/from stand Toileting - Clothing Manipulation Details (indicate cue type and reason): at bathroom level, based on clinical judgement              Pertinent Vitals/Pain Pain Assessment Pain Assessment: No/denies pain     Extremity/Trunk Assessment Upper Extremity Assessment Upper Extremity Assessment: Right hand dominant;RUE deficits/detail;LUE deficits/detail RUE Deficits / Details: AROM WFL. Functional grip strength LUE Deficits / Details: AROM WFL. Functional grip strength   Lower Extremity Assessment Lower Extremity Assessment: LLE deficits/detail;RLE deficits/detail RLE Deficits / Details: AROM WFL LLE Deficits / Details: AROM WFL      Communication Communication Communication: No  apparent difficulties   Cognition Arousal: Alert Behavior During Therapy: WFL for tasks assessed/performed               OT - Cognition Comments: Oriented x4                  Following commands: Intact                  Home Living Family/patient expects to be discharged to:: Private residence Living Arrangements: Children (daughter and 8 yr old granddaughter) Available Help at Discharge: Family Type of Home: House Home Access: Stairs to enter Secretary/administrator of Steps: 3 Entrance Stairs-Rails: None Home Layout: One level               Home Equipment: None          Prior Functioning/Environment Prior Level of Function : Independent/Modified Independent;Working/employed;Driving             Mobility Comments:  (Independent with ambulation.) ADLs Comments: She was independent with ADLs, cooking, cleaning, driving, and working full-time as a Lawyer.    OT Problem List: Decreased strength;Impaired balance (sitting and/or standing);Decreased safety awareness;Decreased knowledge of use of DME or AE   OT Treatment/Interventions: Self-care/ADL training;Therapeutic exercise;Therapeutic activities;Energy conservation;DME and/or AE instruction;Patient/family education;Balance training      OT Goals(Current goals can be found in the care plan section)   Acute Rehab OT Goals OT Goal Formulation: With patient Time For Goal Achievement: 05/04/24 Potential to Achieve Goals: Good ADL Goals Pt Will Perform Grooming: Independently;standing Pt Will Perform Lower Body Dressing: Independently;sit to/from stand Pt Will Transfer to Toilet: Independently;ambulating Pt Will Perform Toileting - Clothing Manipulation and hygiene: Independently;sit to/from stand   OT Frequency:  Min 2X/week    Co-evaluation PT/OT/SLP Co-Evaluation/Treatment: Yes Reason for Co-Treatment: For patient/therapist safety;To address functional/ADL transfers PT goals addressed during session: Mobility/safety with mobility OT goals addressed during session: ADL's and self-care      AM-PAC OT 6 Clicks Daily Activity     Outcome Measure Help from another person  eating meals?: None Help from another person taking care of personal grooming?: A Little Help from another person toileting, which includes using toliet, bedpan, or urinal?: A Little Help from another person bathing (including washing, rinsing, drying)?: A Little Help from another person to put on and taking off regular upper body clothing?: A Little Help from another person to put on and taking off regular lower body clothing?: A Little 6 Click Score: 19   End of Session Equipment Utilized During Treatment: Gait belt Nurse Communication: Other (comment) (Nurse cleared the pt for therapy participation)  Activity Tolerance: Patient tolerated treatment well Patient left: in chair;with call bell/phone within reach;with chair alarm set  OT Visit Diagnosis: Muscle weakness (generalized) (M62.81);History of falling (Z91.81);Unsteadiness on feet (R26.81)                Time: 8945-8881 OT Time Calculation (min): 24 min Charges:  OT General Charges $OT Visit: 1 Visit OT Evaluation $OT Eval Moderate Complexity: 1 Mod   Warner Laduca L Maureen Delatte, OTR/L 04/20/2024, 1:19 PM

## 2024-04-20 NOTE — Evaluation (Signed)
 Physical Therapy Evaluation Patient Details Name: Whitney Mitchell MRN: [id number] DOB: September 25, 1961 Today's Date: 04/20/2024  History of Present Illness  63 yo female presents to therapy following hospital admission on 04/19/2024 due to progressive SOB, weakness, poor PO intake, nausea, recent weight loss, dizziness and dysuria. Pt dx with diabetic ketoacidosis, sepsis secondary to UTI with possible pyelonephritis and AKI.  Pt PMH includes but is not limited to: DM II, HTN, pituitary adenoma s/p surgery in 2019 and urinary retention.  Clinical Impression      Pt admitted with above diagnosis.  Pt currently with functional limitations due to the deficits listed below (see PT Problem List). Pt in bed when therapist arrived. Pt agreeable to therapy eval. Pt indicated no pain or SOB, however endorsed dizziness with positional changes, please see below. Pt required S and min cues for supine to sit, CGA with HHA for sit to stand  from EOB, HHA with CGA to min A for gait tasks in hallway with noted LOB x 2 in 80 feet. Pt seated in recliner, all needs in place. Pt is motivated to return home.Pt will benefit from acute skilled PT to increase their independence and safety with mobility to allow discharge.   Bp seated EOB 148/80 Bp standing 126/85 with reports of dizziness     If plan is discharge home, recommend the following: A little help with walking and/or transfers;A little help with bathing/dressing/bathroom;Assistance with cooking/housework;Assist for transportation;Help with stairs or ramp for entrance   Can travel by private vehicle        Equipment Recommendations Other (comment) (TBD as pt progresses possible SPC)  Recommendations for Other Services       Functional Status Assessment Patient has had a recent decline in their functional status and demonstrates the ability to make significant improvements in function in a reasonable and predictable amount of time.     Precautions / Restrictions  Precautions Precautions: Fall (orthostatic hypotension) Restrictions Weight Bearing Restrictions Per Provider Order: No      Mobility  Bed Mobility Overal bed mobility: Needs Assistance Bed Mobility: Supine to Sit     Supine to sit: Supervision, HOB elevated     General bed mobility comments: min cues    Transfers Overall transfer level: Needs assistance Equipment used: Rolling walker (2 wheels), 1 person hand held assist Transfers: Sit to/from Stand Sit to Stand: Contact guard assist           General transfer comment: pt reported feeling dizzy with inital standing and noted LOB with HHA and cues to maintain eyes open Bp monitored    Ambulation/Gait Ambulation/Gait assistance: Contact guard assist, Min assist Gait Distance (Feet): 80 Feet Assistive device: 1 person hand held assist Gait Pattern/deviations: Step-through pattern, Staggering left, Staggering right, Narrow base of support Gait velocity: decreased     General Gait Details: min cues and occational LOB with pt reporting she was feeling weak and a little off  Stairs            Wheelchair Mobility     Tilt Bed    Modified Rankin (Stroke Patients Only)       Balance Overall balance assessment: History of Falls, Mild deficits observed, not formally tested (pt reports one fall prior to hospital admission)  Pertinent Vitals/Pain Pain Assessment Pain Assessment: No/denies pain    Home Living Family/patient expects to be discharged to:: Private residence Living Arrangements: Children Available Help at Discharge: Family Type of Home: House Home Access: Stairs to enter Entrance Stairs-Rails: None Entrance Stairs-Number of Steps: 3   Home Layout: One level        Prior Function Prior Level of Function : Independent/Modified Independent;Working/employed;Driving             Mobility Comments: IND no AD for all ADLs, self  care tasks and IADLs, 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       Extremity/Trunk Assessment   Upper Extremity Assessment Upper Extremity Assessment: Defer to OT evaluation    Lower Extremity Assessment Lower Extremity Assessment: Overall WFL for tasks assessed    Cervical / Trunk Assessment Cervical / Trunk Assessment: Normal  Communication   Communication Communication: No apparent difficulties    Cognition Arousal: Alert Behavior During Therapy: WFL for tasks assessed/performed   PT - Cognitive impairments: No apparent impairments                         Following commands: Intact       Cueing       General Comments      Exercises     Assessment/Plan    PT Assessment Patient needs continued PT services  PT Problem List Decreased activity tolerance;Decreased balance       PT Treatment Interventions Gait training;Stair training;Therapeutic activities;Functional mobility training;Therapeutic exercise;Balance training;Neuromuscular re-education;Patient/family education    PT Goals (Current goals can be found in the Care Plan section)  Acute Rehab PT Goals Patient Stated Goal: to feet better, get stronger and go home PT Goal Formulation: With patient Time For Goal Achievement: 05/04/24 Potential to Achieve Goals: Good    Frequency Min 3X/week     Co-evaluation PT/OT/SLP Co-Evaluation/Treatment: Yes Reason for Co-Treatment: Complexity of the patient's impairments (multi-system involvement);For patient/therapist safety;Necessary to address cognition/behavior during functional activity PT goals addressed during session: Mobility/safety with mobility;Balance;Proper use of DME OT goals addressed during session: ADL's and self-care;Proper use of Adaptive equipment and DME       AM-PAC PT 6 Clicks Mobility  Outcome Measure Help needed turning from your back to your side while in a flat bed without using bedrails?:  None Help needed moving from lying on your back to sitting on the side of a flat bed without using bedrails?: None Help needed moving to and from a bed to a chair (including a wheelchair)?: A Little Help needed standing up from a chair using your arms (e.g., wheelchair or bedside chair)?: A Little Help needed to walk in hospital room?: A Little Help needed climbing 3-5 steps with a railing? : Total 6 Click Score: 18    End of Session Equipment Utilized During Treatment: Gait belt Activity Tolerance: Patient tolerated treatment well Patient left: in chair;with call bell/phone within reach;with chair alarm set Nurse Communication: Mobility status PT Visit Diagnosis: Unsteadiness on feet (R26.81);Other abnormalities of gait and mobility (R26.89);Muscle weakness (generalized) (M62.81);History of falling (Z91.81);Difficulty in walking, not elsewhere classified (R26.2)    Time: 8945-8881 PT Time Calculation (min) (ACUTE ONLY): 24 min   Charges:   PT Evaluation $PT Eval Low Complexity: 1 Low PT Treatments $Gait Training: 8-22 mins PT General Charges $$ ACUTE PT VISIT: 1 Visit         [place], PT Acute Rehab   [place] VEAR Drone  04/20/2024, 12:12 PM

## 2024-04-21 DIAGNOSIS — E1010 Type 1 diabetes mellitus with ketoacidosis without coma: Secondary | ICD-10-CM | POA: Diagnosis not present

## 2024-04-21 LAB — CBC WITH DIFFERENTIAL/PLATELET
Abs Immature Granulocytes: 0.23 10*3/uL — ABNORMAL HIGH (ref 0.00–0.07)
Basophils Absolute: 0 10*3/uL (ref 0.0–0.1)
Basophils Relative: 1 %
Eosinophils Absolute: 0 10*3/uL (ref 0.0–0.5)
Eosinophils Relative: 0 %
HCT: 30.9 % — ABNORMAL LOW (ref 36.0–46.0)
Hemoglobin: 9.9 g/dL — ABNORMAL LOW (ref 12.0–15.0)
Immature Granulocytes: 3 %
Lymphocytes Relative: 13 %
Lymphs Abs: 1 10*3/uL (ref 0.7–4.0)
MCH: 24.2 pg — ABNORMAL LOW (ref 26.0–34.0)
MCHC: 32 g/dL (ref 30.0–36.0)
MCV: 75.6 fL — ABNORMAL LOW (ref 80.0–100.0)
Monocytes Absolute: 0.6 10*3/uL (ref 0.1–1.0)
Monocytes Relative: 8 %
Neutro Abs: 5.6 10*3/uL (ref 1.7–7.7)
Neutrophils Relative %: 75 %
Platelets: 249 10*3/uL (ref 150–400)
RBC: 4.09 MIL/uL (ref 3.87–5.11)
RDW: 14.2 % (ref 11.5–15.5)
WBC: 7.5 10*3/uL (ref 4.0–10.5)
nRBC: 0 % (ref 0.0–0.2)

## 2024-04-21 LAB — BASIC METABOLIC PANEL WITH GFR
Anion gap: 8 (ref 5–15)
BUN: 22 mg/dL (ref 8–23)
CO2: 21 mmol/L — ABNORMAL LOW (ref 22–32)
Calcium: 8.9 mg/dL (ref 8.9–10.3)
Chloride: 104 mmol/L (ref 98–111)
Creatinine, Ser: 1.06 mg/dL — ABNORMAL HIGH (ref 0.44–1.00)
GFR, Estimated: 59 mL/min — ABNORMAL LOW (ref >60)
Glucose, Bld: 236 mg/dL — ABNORMAL HIGH (ref 70–99)
Potassium: 3.7 mmol/L (ref 3.5–5.1)
Sodium: 133 mmol/L — ABNORMAL LOW (ref 135–145)

## 2024-04-21 LAB — GLUCOSE, CAPILLARY
Glucose-Capillary: 224 mg/dL — ABNORMAL HIGH (ref 70–99)
Glucose-Capillary: 171 mg/dL — ABNORMAL HIGH (ref 70–99)
Glucose-Capillary: 218 mg/dL — ABNORMAL HIGH (ref 70–99)
Glucose-Capillary: 255 mg/dL — ABNORMAL HIGH (ref 70–99)

## 2024-04-21 NOTE — Progress Notes (Signed)
   04/21/24 1542  TOC Brief Assessment  Insurance and Status Lapsed  Patient has primary care physician Yes Lily, Kennieth, MD)  Home environment has been reviewed Home with daughter  Prior level of function: Independent  Prior/Current Home Services No current home services  Social Drivers of Health Review SDOH reviewed no interventions necessary  Readmission risk has been reviewed Yes  Transition of care needs no transition of care needs at this time

## 2024-04-21 NOTE — Progress Notes (Signed)
 Occupational Therapy Treatment Patient Details Name: Whitney Mitchell MRN: [id number] DOB: [birth year] Today's Date: 04/21/2024   History of present illness 63 yr old female who presented to the hospital on 04/19/2024 due to progressive SOB, weakness, poor PO intake, nausea, recent weight loss, dizziness and dysuria. Pt dx with diabetic ketoacidosis, sepsis secondary to UTI with possible pyelonephritis and AKI.  Pt PMH includes but is not limited to: DM II, HTN, pituitary adenoma s/p surgery in 2019 and urinary retention.   OT comments  The pt presented with much improved overall functional abilities this date. She progressed to performing lower body dressing, a toilet transfer at bathroom level, grooming in standing at the sink, and ambulating in the hall without an assistive device independently. She has met her OT goals and does not require further OT services. OT will sign off and recommend she return home with family at discharge.       If plan is discharge home, recommend the following:  Help with stairs or ramp for entrance   Equipment Recommendations  None recommended by OT    Recommendations for Other Services      Precautions / Restrictions Restrictions Weight Bearing Restrictions Per Provider Order: No       Mobility Bed Mobility Overal bed mobility: Independent Bed Mobility: Supine to Sit     Supine to sit: Independent          Transfers Overall transfer level: Independent Equipment used: None Transfers: Sit to/from Stand Sit to Stand: Independent                 Balance     Sitting balance-Leahy Scale: Normal       Standing balance-Leahy Scale: Good         ADL either performed or assessed with clinical judgement   ADL Overall ADL's : Independent;At baseline     Grooming: Independent;Standing Grooming Details (indicate cue type and reason): She performed hand washing in standing at the sink.         Upper Body Dressing : Independent;Sitting    Lower Body Dressing: Independent;Sitting/lateral leans Lower Body Dressing Details (indicate cue type and reason): for donning socks seated EOB Toilet Transfer: Independent;Regular Teacher, adult education Details (indicate cue type and reason): She performed a toilet transfer at bathroom level. Toileting- Clothing Manipulation and Hygiene: Independent Toileting - Clothing Manipulation Details (indicate cue type and reason): She reported getting to the bathroom earlier today and managing tasks without the need for assistance.                      Communication Communication Communication: No apparent difficulties   Cognition Arousal: Alert Behavior During Therapy: WFL for tasks assessed/performed               OT - Cognition Comments: Oriented x4                 Following commands: Intact                      Pertinent Vitals/ Pain       Pain Assessment Pain Assessment: No/denies pain   Frequency   (N/A)        Progress Toward Goals  OT Goals(current goals can now be found in the care plan section)  Progress towards OT goals: Goals met/education completed, patient discharged from OT            AM-PAC OT 6 Clicks Daily Activity  Outcome Measure   Help from another person eating meals?: None Help from another person taking care of personal grooming?: None Help from another person toileting, which includes using toliet, bedpan, or urinal?: None Help from another person bathing (including washing, rinsing, drying)?: None Help from another person to put on and taking off regular upper body clothing?: None Help from another person to put on and taking off regular lower body clothing?: None 6 Click Score: 24    End of Session Equipment Utilized During Treatment: Other (comment) (N/A)  OT Visit Diagnosis: Muscle weakness (generalized) (M62.81);History of falling (Z91.81)   Activity Tolerance Patient tolerated treatment well   Patient  Left in bed;with call bell/phone within reach   Nurse Communication Other (comment)        Time: 8361-8345 OT Time Calculation (min): 16 min  Charges: OT General Charges $OT Visit: 1 Visit OT Treatments $Self Care/Home Management : 8-22 mins     Delanna LITTIE Molt, OTR/L 04/21/2024, 4:59 PM

## 2024-04-21 NOTE — Plan of Care (Signed)
  Problem: Education: Goal: Knowledge of General Education information will improve Description: Including pain rating scale, medication(s)/side effects and non-pharmacologic comfort measures Outcome: Progressing   Problem: Health Behavior/Discharge Planning: Goal: Ability to manage health-related needs will improve Outcome: Progressing   Problem: Activity: Goal: Risk for activity intolerance will decrease Outcome: Progressing   Problem: Elimination: Goal: Will not experience complications related to bowel motility Outcome: Progressing Goal: Will not experience complications related to urinary retention Outcome: Progressing   Problem: Pain Managment: Goal: General experience of comfort will improve and/or be controlled Outcome: Progressing

## 2024-04-21 NOTE — Progress Notes (Signed)
 Mobility Specialist - Progress Note   04/21/24 1333  Mobility  Activity Ambulated with assistance in hallway  Level of Assistance Modified independent, requires aide device or extra time  Assistive Device Front wheel walker  Distance Ambulated (ft) 250 ft  Activity Response Tolerated well  Mobility Referral Yes  Mobility visit 1 Mobility  Mobility Specialist Start Time (ACUTE ONLY) 1250  Mobility Specialist Stop Time (ACUTE ONLY) 1303  Mobility Specialist Time Calculation (min) (ACUTE ONLY) 13 min   Pt received in bed and agreeable to mobility. No complaints during session. Pt to bed after session with all needs met.    [facility]

## 2024-04-21 NOTE — Progress Notes (Addendum)
 Progress Note   Patient: Whitney Mitchell FMW:[id number] DOB: Mar 03, 1961 DOA: 04/19/2024     2 DOS: the patient was seen and examined on 04/21/2024   Brief hospital course: 63yo with h/o DM, HTN, and urinary retention on self-caths who presented on 7/1 with generalized weakness.  She was found to be in DKA and was started on Endotool. Also with severe sepsis due to UTI; CT with B pyelonephritis, started on Ceftriaxone .    Assessment and Plan:  Type 2 diabetes with diabetic ketoacidosis Hyperglycemic to 554 with high anion gap metabolic acidosis and bicarb to 15   BHB elevated to 3.6   Patient is on Jardiance and Ozempic  at home Started on Endotool with resolution of DKA  Not a candidate for SGLT2 inhibitors moving forward Diabetic coordinator consulted A1c is 14.2, very poor baseline control Will start basal/bolus insulin  and she is likely to need this at home   Severe sepsis due to UTI/possible pyelonephritis Tachycardia with tachypnea, leukocytosis, and AKI She has a history of urinary retension for which she has been doing self-catheterization for about a month but is not doing this consistently; self-cath may be related to UTI but UTI may also be associated with chronic suboptimally treated urinary retention UA concerning for UTI CT concerning for bilateral pyelonephritis although she has no CVA tenderness or fever Will need ongoing Foley catheter vs. I/O Follow blood and urine cultures Continue ceftriaxone   Avoid SGLT2 inhibitors moving forward   Acute urinary retention with bilateral hydronephrosis Foley catheter inserted Urology is consulting Urology recommends resumption of I/O cath at home and dc of foley just prior to dc Outpatient urodynamic studies We discussed options (foley vs. I/O) - she acknowledges prior non-compliance with self-caths and vows to be better at this   Acute kidney injury Multifactorial including DKA, sepsis and meds IV fluid and Foley catheter as  above Avoid nephrotoxic meds   Unintentional weight loss Suspect this to be due to poorly controlled diabetes and Ozempic  TSH and free T4 within normal. Diabetic control Consult dietitian She may have to come off of Ozempic  as well as Jardiance             Consultants: Urology   Procedures: None   Antibiotics: Ceftriaxone  7/1-    30 Day Unplanned Readmission Risk Score    Flowsheet Row ED to Hosp-Admission (Current) from 04/19/2024 in [place] [facility]  30 Day Unplanned Readmission Risk Score (%) 10 Filed at 04/21/2024 1600    This score is the patient's risk of an unplanned readmission within 30 days of being discharged (0 -100%). The score is based on dignosis, age, lab data, medications, orders, and past utilization.   Low:  0-14.9   Medium: 15-21.9   High: 22-29.9   Extreme: 30 and above           Subjective: Feeling better but still fatigued.  Thinks she needs one more day of hospitalization.   Objective: Vitals:   04/21/24 0359 04/21/24 1451  BP: 128/74 126/77  Pulse: 80 89  Resp: 14 14  Temp: 98.5 F (36.9 C) 98.2 F (36.8 C)  SpO2: 99% 97%    Intake/Output Summary (Last 24 hours) at 04/21/2024 1704 Last data filed at 04/21/2024 1417 Gross per 24 hour  Intake 220 ml  Output 2700 ml  Net -2480 ml   Filed Weights   04/19/24 1441 04/19/24 2245  Weight: 63.5 kg 59.8 kg    Exam:  General:  Appears calm and comfortable  and is in NAD, sitting up at the bedside Eyes:  normal lids, iris ENT:  grossly normal hearing, lips & tongue, mmm Cardiovascular:  RRR, no m/r/g. No LE edema.  Respiratory:   CTA bilaterally with no wheezes/rales/rhonchi.  Normal respiratory effort. Abdomen:  soft, NT, ND Skin:  no rash or induration seen on limited exam Musculoskeletal:  grossly normal tone BUE/BLE, good ROM, no bony abnormality Psychiatric:  grossly normal mood and affect, speech fluent and appropriate, AOx3 Neurologic:  CN 2-12 grossly intact, moves  all extremities in coordinated fashion  Data Reviewed: I have reviewed the patient's lab results since admission.  Pertinent labs for today include:   Na++ 133 Glucose 236 BUN 22/Creatinine 1.06/GFR 39, improving WBC 7.5 Hgb 9.9     Family Communication: None present  Disposition: Status is: Inpatient Remains inpatient appropriate because: ongoing management     Time spent: 50 minutes  Unresulted Labs (From admission, onward)     Start     Ordered   04/22/24 0500  CBC with Differential/Platelet  Tomorrow morning,   R       Question:  Specimen collection method  Answer:  Lab=Lab collect   04/21/24 1703   04/21/24 0500  Basic metabolic panel  Daily,   R     Question:  Specimen collection method  Answer:  Lab=Lab collect   04/20/24 0916   04/20/24 1147  MRSA Next Gen by PCR, Nasal  Once,   R        04/20/24 1146             Author: Delon Herald, MD 04/21/2024 5:04 PM  For on call review [email or web address].

## 2024-04-22 DIAGNOSIS — E1110 Type 2 diabetes mellitus with ketoacidosis without coma: Secondary | ICD-10-CM | POA: Diagnosis not present

## 2024-04-22 LAB — CBC WITH DIFFERENTIAL/PLATELET
Abs Immature Granulocytes: 0 K/uL (ref 0.00–0.07)
Band Neutrophils: 2 %
Basophils Absolute: 0 K/uL (ref 0.0–0.1)
Basophils Relative: 0 %
Eosinophils Absolute: 0 K/uL (ref 0.0–0.5)
Eosinophils Relative: 0 %
HCT: 31.9 % — ABNORMAL LOW (ref 36.0–46.0)
Hemoglobin: 10 g/dL — ABNORMAL LOW (ref 12.0–15.0)
Lymphocytes Relative: 21 %
Lymphs Abs: 1 K/uL (ref 0.7–4.0)
MCH: 24 pg — ABNORMAL LOW (ref 26.0–34.0)
MCHC: 31.3 g/dL (ref 30.0–36.0)
MCV: 76.5 fL — ABNORMAL LOW (ref 80.0–100.0)
Monocytes Absolute: 0.3 K/uL (ref 0.1–1.0)
Monocytes Relative: 7 %
Neutro Abs: 3.5 K/uL (ref 1.7–7.7)
Neutrophils Relative %: 70 %
Platelets: 255 K/uL (ref 150–400)
RBC: 4.17 MIL/uL (ref 3.87–5.11)
RDW: 14.1 % (ref 11.5–15.5)
WBC: 4.9 K/uL (ref 4.0–10.5)
nRBC: 0 % (ref 0.0–0.2)
nRBC: 1 /100{WBCs} — ABNORMAL HIGH

## 2024-04-22 LAB — BASIC METABOLIC PANEL WITH GFR
Anion gap: 15 (ref 5–15)
BUN: 19 mg/dL (ref 8–23)
CO2: 18 mmol/L — ABNORMAL LOW (ref 22–32)
Calcium: 9 mg/dL (ref 8.9–10.3)
Chloride: 103 mmol/L (ref 98–111)
Creatinine, Ser: 1.04 mg/dL — ABNORMAL HIGH (ref 0.44–1.00)
GFR, Estimated: >60 mL/min (ref >60)
Glucose, Bld: 209 mg/dL — ABNORMAL HIGH (ref 70–99)
Potassium: 4.7 mmol/L (ref 3.5–5.1)
Sodium: 136 mmol/L (ref 135–145)

## 2024-04-22 LAB — CULTURE, BLOOD (ROUTINE X 2)

## 2024-04-22 LAB — GLUCOSE, CAPILLARY
Glucose-Capillary: 202 mg/dL — ABNORMAL HIGH (ref 70–99)
Glucose-Capillary: 283 mg/dL — ABNORMAL HIGH (ref 70–99)

## 2024-04-22 MED ORDER — SODIUM BICARBONATE 650 MG PO TABS
650.0000 mg | ORAL_TABLET | Freq: Two times a day (BID) | ORAL | Status: DC
  Administered 2024-04-22: 650 mg via ORAL
  Filled 2024-04-22: qty 1

## 2024-04-22 MED ORDER — ENSURE PLUS HIGH PROTEIN PO LIQD: 237.0000 mL | Freq: Two times a day (BID) | ORAL | 0 refills | Status: DC

## 2024-04-22 MED ORDER — INSULIN ASPART 100 UNIT/ML IJ SOLN: 3.0000 [IU] | Freq: Three times a day (TID) | INTRAMUSCULAR | 11 refills | Status: DC

## 2024-04-22 MED ORDER — INSULIN ASPART 100 UNIT/ML IJ SOLN: 3.0000 [IU] | Freq: Three times a day (TID) | INTRAMUSCULAR | 11 refills | Status: AC

## 2024-04-22 MED ORDER — SULFAMETHOXAZOLE-TRIMETHOPRIM 800-160 MG PO TABS: 1.0000 | ORAL_TABLET | Freq: Two times a day (BID) | ORAL | 0 refills | Status: DC

## 2024-04-22 MED ORDER — SODIUM BICARBONATE 650 MG PO TABS: 650.0000 mg | ORAL_TABLET | Freq: Two times a day (BID) | ORAL | 0 refills | Status: AC

## 2024-04-22 MED ORDER — SULFAMETHOXAZOLE-TRIMETHOPRIM 800-160 MG PO TABS: 1.0000 | ORAL_TABLET | Freq: Two times a day (BID) | ORAL | 0 refills | Status: AC

## 2024-04-22 MED ORDER — LANTUS 100 UNIT/ML ~~LOC~~ SOLN
10.0000 [IU] | Freq: Every morning | SUBCUTANEOUS | 11 refills | Status: DC
Start: 2024-04-22 — End: 2024-04-22

## 2024-04-22 MED ORDER — ONDANSETRON HCL 4 MG/2ML IJ SOLN
4.0000 mg | Freq: Once | INTRAMUSCULAR | Status: AC
  Administered 2024-04-22: 4 mg via INTRAVENOUS
  Filled 2024-04-22: qty 2

## 2024-04-22 MED ORDER — ENSURE PLUS HIGH PROTEIN PO LIQD: 237.0000 mL | Freq: Two times a day (BID) | ORAL | 0 refills | Status: AC

## 2024-04-22 MED ORDER — LANTUS 100 UNIT/ML ~~LOC~~ SOLN: 10.0000 [IU] | Freq: Every morning | SUBCUTANEOUS | 11 refills | Status: AC

## 2024-04-22 MED ORDER — SODIUM BICARBONATE 650 MG PO TABS: 650.0000 mg | ORAL_TABLET | Freq: Two times a day (BID) | ORAL | 0 refills | Status: DC

## 2024-04-22 NOTE — Plan of Care (Signed)
°  Problem: Education: Goal: Knowledge of General Education information will improve Description: Including pain rating scale, medication(s)/side effects and non-pharmacologic comfort measures Outcome: Progressing   Problem: Clinical Measurements: Goal: Ability to maintain clinical measurements within normal limits will improve Outcome: Progressing   Problem: Elimination: Goal: Will not experience complications related to bowel motility Outcome: Progressing Goal: Will not experience complications related to urinary retention Outcome: Progressing

## 2024-04-22 NOTE — TOC Transition Note (Signed)
 Transition of Care [facility]) - Discharge Note   Patient Details  Name: Whitney Mitchell MRN: [id number] Date of Birth: 07-Feb-1961  Transition of Care [facility] CM/SW Contact:  Toy LITTIE Agar, RN Phone Number:[phone]  04/22/2024, 12:18 PM   Clinical Narrative:    Patient with discharge orders. CM at bedside to discuss [facility] PT recommendations. Patient states that she is independent and ambulating independently here at hospital and does not want HH services. No other TOC needs noted. TOC will sign off.      Barriers to Discharge: Continued Medical Work up   Patient Goals and CMS Choice            Discharge Placement                       Discharge Plan and Services Additional resources added to the After Visit Summary for                                       Social Drivers of Health (SDOH) Interventions SDOH Screenings   Food Insecurity: No Food Insecurity (04/20/2024)  Housing: Low Risk  (04/20/2024)  Transportation Needs: No Transportation Needs (04/20/2024)  Utilities: Not At Risk (04/20/2024)  Tobacco Use: Low Risk  (04/19/2024)     Readmission Risk Interventions     No data to display

## 2024-04-22 NOTE — Discharge Summary (Signed)
 Physician Discharge Summary   Patient: Whitney Mitchell MRN: [id number] DOB: 1960-12-09  Admit date:     04/19/2024  Discharge date: 04/22/24  Discharge Physician: Delon Herald   PCP: Benjamine Aland, MD   Recommendations at discharge:   You are being discharged home with physical therapy Take antibiotics until gone (Bactrim  twice daily through 7/7) Stop taking Jardiance Continue Lantus  but increase to 10 units daily and add short-acting mealtime insulin  (Novolog  3 units with meals) Take sodium bicarbonate  twice daily for 3 days Do in and out caths 2-4 times every single day Follow up next week with Dr. Benjamine; will need to ensure clearance of bacteremia on cultures (currently pending) Follow up with Dr. Elisabeth from urology; call for an appointment  Discharge Diagnoses: Principal Problem:   DKA (diabetic ketoacidosis) (HCC) Active Problems:   Uncontrolled type 2 diabetes mellitus with hyperglycemia (HCC)   Sepsis secondary to UTI [facility])   Acute urinary retention   Bilateral hydronephrosis   AKI (acute kidney injury) (HCC)   Weight loss, unintentional    Hospital Course: 63yo with h/o DM, HTN, and urinary retention on self-caths who presented on 7/1 with generalized weakness.  She was found to be in DKA and was started on Endotool. Also with severe sepsis due to UTI; CT with B pyelonephritis, started on Ceftriaxone .    Assessment and Plan:  Type 2 diabetes with diabetic ketoacidosis Hyperglycemic to 554 with high anion gap metabolic acidosis and bicarb to 15   BHB elevated to 3.6   Patient is on Jardiance and Ozempic  at home Started on Endotool with resolution of DKA  Not a candidate for SGLT2 inhibitors moving forward Diabetic coordinator consulted A1c is 14.2, very poor baseline control Will start basal/bolus insulin  and she will need to continue this at home (increase Lantus  to 10 units and add 3 units Novolog  TID with meals)   Severe sepsis due to UTI with E coli  bacteremia Tachycardia with tachypnea, leukocytosis, and AKI She has a history of urinary retension for which she has been doing self-catheterization for about a month but is not doing this consistently; self-cath may be related to UTI but UTI may also be associated with chronic suboptimally treated urinary retention UA concerning for UTI CT concerning for bilateral pyelonephritis although she has no CVA tenderness or fever Will need ongoing Foley catheter vs. I/O Blood cultures positive for E coli, resistant to Ampicillin, Amp/Sul, Cefazolin Continue ceftriaxone  -> Bactrim  to complete 7 days Repeat blood cultures were drawn prior to dc to ensure clearance and will need to be followed up next week by PCP Avoid SGLT2 inhibitors moving forward   Acute urinary retention with bilateral hydronephrosis Foley catheter inserted Urology is consulting Urology recommends resumption of I/O cath at home and dc of foley just prior to dc Outpatient urodynamic studies We discussed options (foley vs. I/O) - she acknowledges prior non-compliance with self-caths and vows to be better at this   Acute kidney injury, resolved Multifactorial including DKA, sepsis and meds IV fluid and Foley catheter as above Avoid nephrotoxic meds  Metabolic acidosis Mild, slightly worse despite otherwise resolution of DKA Will add bicarb x 3 days  HTN Appears to no longer be taking irbesartan-hydrochlorothiazide BP 115/65-129/88 Will not restart medication at this time  HLD Continue rosuvastatin    Unintentional weight loss Suspect this to be due to poorly controlled diabetes and Ozempic  TSH and free T4 within normal. Diabetic control Consulted dietitian - add Ensure Plus High Protein She may  have to come off of Ozempic  as well as Jardiance             Consultants: Urology   Procedures: None   Antibiotics: Ceftriaxone  7/1-4   Pain control - [place]  Controlled Substance Reporting System  database was reviewed. and patient was instructed, not to drive, operate heavy machinery, perform activities at heights, swimming or participation in water activities or provide baby-sitting services while on Pain, Sleep and Anxiety Medications; until their outpatient Physician has advised to do so again. Also recommended to not to take more than prescribed Pain, Sleep and Anxiety Medications.   Disposition: Home Diet recommendation:  Carb modified diet DISCHARGE MEDICATION: Allergies as of 04/22/2024       Reactions   Pollen Extract Itching, Other (See Comments)   Itchy eyes, runny nose, sneezing = Seasonal allergies        Medication List     STOP taking these medications    empagliflozin 25 MG Tabs tablet Commonly known as: JARDIANCE   irbesartan-hydrochlorothiazide 150-12.5 MG tablet Commonly known as: AVALIDE       TAKE these medications    feeding supplement Liqd Take 237 mLs by mouth 2 (two) times daily between meals.   insulin  aspart 100 UNIT/ML injection Commonly known as: novoLOG  Inject 3 Units into the skin 3 (three) times daily with meals.   Lantus  100 UNIT/ML injection Generic drug: insulin  glargine Inject 0.1 mLs (10 Units total) into the skin in the morning. What changed: how much to take   rosuvastatin  20 MG tablet Commonly known as: CRESTOR  Take 20 mg by mouth daily.   sodium bicarbonate  650 MG tablet Take 1 tablet (650 mg total) by mouth 2 (two) times daily for 3 days.   sulfamethoxazole -trimethoprim  800-160 MG tablet Commonly known as: Bactrim  DS Take 1 tablet by mouth 2 (two) times daily for 4 days.   TYLENOL  500 MG tablet Generic drug: acetaminophen  Take 500-1,000 mg by mouth every 6 (six) hours as needed (for headaches).        Discharge Exam:   Subjective: She feels well and is eager to go home today.   Objective: Vitals:   04/21/24 2040 04/22/24 0531  BP: 129/88 115/65  Pulse: 87 78  Resp: 17 (!) 22  Temp: 98.3 F (36.8  C) 98 F (36.7 C)  SpO2: 98% 100%    Intake/Output Summary (Last 24 hours) at 04/22/2024 0752 Last data filed at 04/22/2024 0734 Gross per 24 hour  Intake 2300 ml  Output 2125 ml  Net 175 ml   Filed Weights   04/19/24 1441 04/19/24 2245  Weight: 63.5 kg 59.8 kg    Exam:  General:  Appears calm and comfortable and is in NAD, eating breakfast Eyes:  normal lids, iris ENT:  grossly normal hearing, lips & tongue, mmm Cardiovascular:  RRR, no m/r/g. No LE edema.  Respiratory:   CTA bilaterally with no wheezes/rales/rhonchi.  Normal respiratory effort. Abdomen:  soft, NT, ND Skin:  no rash or induration seen on limited exam Musculoskeletal:  grossly normal tone BUE/BLE, good ROM, no bony abnormality Psychiatric:  grossly normal mood and affect, speech fluent and appropriate, AOx3 Neurologic:  CN 2-12 grossly intact, moves all extremities in coordinated fashion  Data Reviewed: I have reviewed the patient's lab results since admission.  Pertinent labs for today include:  CO2 18, down from 21 Glucose 209 BUN 19/Creatinine 1.04/GFR >60    Condition at discharge: improving  The results of significant diagnostics from this hospitalization (  including imaging, microbiology, ancillary and laboratory) are listed below for reference.   Imaging Studies: CT Angio Chest PE W and/or Wo Contrast Result Date: 04/19/2024 CLINICAL DATA:  Shortness of breath abdominal pain.  Weight loss. EXAM: CT ANGIOGRAPHY CHEST CT ABDOMEN AND PELVIS WITH CONTRAST TECHNIQUE: Multidetector CT imaging of the chest was performed using the standard protocol during bolus administration of intravenous contrast. Multiplanar CT image reconstructions and MIPs were obtained to evaluate the vascular anatomy. Multidetector CT imaging of the abdomen and pelvis was performed using the standard protocol during bolus administration of intravenous contrast. RADIATION DOSE REDUCTION: This exam was performed according to the  departmental dose-optimization program which includes automated exposure control, adjustment of the mA and/or kV according to patient size and/or use of iterative reconstruction technique. CONTRAST:  80mL OMNIPAQUE  IOHEXOL  350 MG/ML SOLN COMPARISON:  CT abdomen pelvis dated 04/10/2020. FINDINGS: CTA CHEST FINDINGS Cardiovascular: There is no cardiomegaly. Small pericardial effusion measures 6 mm thickness anterior to the heart. The thoracic aorta is unremarkable. The origins of the great vessels of the aortic arch are patent. No pulmonary artery embolus identified. Mediastinum/Nodes: No hilar or mediastinal adenopathy. Small hiatal hernia. The esophagus is grossly unremarkable. No mediastinal fluid collection. Lungs/Pleura: Linear and streaky atelectasis/scarring in the right middle and right lower lobe. No consolidative changes. There is no pleural effusion pneumothorax. The central airways are patent. Musculoskeletal: Degenerative changes of the spine. No acute osseous pathology. Review of the MIP images confirms the above findings. CT ABDOMEN and PELVIS FINDINGS No intra-abdominal free air or free fluid. Hepatobiliary: The liver is unremarkable. There is mild dilatation of the left intrahepatic biliary trees. The gallbladder is unremarkable. Pancreas: Unremarkable. No pancreatic ductal dilatation or surrounding inflammatory changes. Spleen: Normal in size without focal abnormality. Adrenals/Urinary Tract: The adrenal glands are unremarkable. There is moderate bilateral hydronephrosis. There is heterogeneous nephrogram bilaterally which may represent congestive changes or pyelonephritis. Correlation with urinalysis recommended. Moderate bilateral hydroureter to the level of the urinary bladder. No obstructing stone. The urinary bladder is moderately distended. Stomach/Bowel: There is moderate stool throughout the colon. There is no bowel obstruction or active inflammation. The appendix is not visualized with  certainty. No inflammatory changes identified in the right lower quadrant. Vascular/Lymphatic: The abdominal aorta and IVC are unremarkable. No portal venous gas. There is no adenopathy. Reproductive: The uterus is grossly unremarkable. No suspicious adnexal masses. Other: None Musculoskeletal: Degenerative changes spine. No acute osseous pathology. Review of the MIP images confirms the above findings. IMPRESSION: 1. No acute intrathoracic pathology. No CT evidence of pulmonary artery embolus. 2. Moderate distension of the urinary bladder may be related to bladder outlet obstruction. Urology consult is advised. 3. There is moderate bilateral hydronephrosis and hydroureter to the level of the urinary bladder. No obstructing stone. 4. Heterogeneous nephrogram bilaterally may represent pyelonephritis. Correlation with urinalysis recommended. 5. No bowel obstruction. Electronically Signed   By: Vanetta Chou M.D.   On: 04/19/2024 20:25   CT ABDOMEN PELVIS W CONTRAST Result Date: 04/19/2024 CLINICAL DATA:  Shortness of breath abdominal pain.  Weight loss. EXAM: CT ANGIOGRAPHY CHEST CT ABDOMEN AND PELVIS WITH CONTRAST TECHNIQUE: Multidetector CT imaging of the chest was performed using the standard protocol during bolus administration of intravenous contrast. Multiplanar CT image reconstructions and MIPs were obtained to evaluate the vascular anatomy. Multidetector CT imaging of the abdomen and pelvis was performed using the standard protocol during bolus administration of intravenous contrast. RADIATION DOSE REDUCTION: This exam was performed according to the departmental  dose-optimization program which includes automated exposure control, adjustment of the mA and/or kV according to patient size and/or use of iterative reconstruction technique. CONTRAST:  80mL OMNIPAQUE  IOHEXOL  350 MG/ML SOLN COMPARISON:  CT abdomen pelvis dated 04/10/2020. FINDINGS: CTA CHEST FINDINGS Cardiovascular: There is no cardiomegaly. Small  pericardial effusion measures 6 mm thickness anterior to the heart. The thoracic aorta is unremarkable. The origins of the great vessels of the aortic arch are patent. No pulmonary artery embolus identified. Mediastinum/Nodes: No hilar or mediastinal adenopathy. Small hiatal hernia. The esophagus is grossly unremarkable. No mediastinal fluid collection. Lungs/Pleura: Linear and streaky atelectasis/scarring in the right middle and right lower lobe. No consolidative changes. There is no pleural effusion pneumothorax. The central airways are patent. Musculoskeletal: Degenerative changes of the spine. No acute osseous pathology. Review of the MIP images confirms the above findings. CT ABDOMEN and PELVIS FINDINGS No intra-abdominal free air or free fluid. Hepatobiliary: The liver is unremarkable. There is mild dilatation of the left intrahepatic biliary trees. The gallbladder is unremarkable. Pancreas: Unremarkable. No pancreatic ductal dilatation or surrounding inflammatory changes. Spleen: Normal in size without focal abnormality. Adrenals/Urinary Tract: The adrenal glands are unremarkable. There is moderate bilateral hydronephrosis. There is heterogeneous nephrogram bilaterally which may represent congestive changes or pyelonephritis. Correlation with urinalysis recommended. Moderate bilateral hydroureter to the level of the urinary bladder. No obstructing stone. The urinary bladder is moderately distended. Stomach/Bowel: There is moderate stool throughout the colon. There is no bowel obstruction or active inflammation. The appendix is not visualized with certainty. No inflammatory changes identified in the right lower quadrant. Vascular/Lymphatic: The abdominal aorta and IVC are unremarkable. No portal venous gas. There is no adenopathy. Reproductive: The uterus is grossly unremarkable. No suspicious adnexal masses. Other: None Musculoskeletal: Degenerative changes spine. No acute osseous pathology. Review of the MIP  images confirms the above findings. IMPRESSION: 1. No acute intrathoracic pathology. No CT evidence of pulmonary artery embolus. 2. Moderate distension of the urinary bladder may be related to bladder outlet obstruction. Urology consult is advised. 3. There is moderate bilateral hydronephrosis and hydroureter to the level of the urinary bladder. No obstructing stone. 4. Heterogeneous nephrogram bilaterally may represent pyelonephritis. Correlation with urinalysis recommended. 5. No bowel obstruction. Electronically Signed   By: Vanetta Chou M.D.   On: 04/19/2024 20:25   CT Head Wo Contrast Result Date: 04/19/2024 CLINICAL DATA:  Headache, history of brain tumor, shortness of breath EXAM: CT HEAD WITHOUT CONTRAST TECHNIQUE: Contiguous axial images were obtained from the base of the skull through the vertex without intravenous contrast. RADIATION DOSE REDUCTION: This exam was performed according to the departmental dose-optimization program which includes automated exposure control, adjustment of the mA and/or kV according to patient size and/or use of iterative reconstruction technique. COMPARISON:  None are available FINDINGS: Brain: No intracranial hemorrhage, mass effect, or evidence of acute infarct. No hydrocephalus. No extra-axial fluid collection. Chronic infarct versus prominent perivascular space in the left basal ganglia. Question pituitary mass versus postoperative change about the sella. Vascular: No hyperdense vessel or unexpected calcification. Skull: No fracture or focal lesion. Sinuses/Orbits: No acute finding. Other: None. IMPRESSION: 1. No acute intracranial abnormality. 2. Question pituitary mass versus postoperative change about the sella. Comparison with prior imaging and history is recommended. Electronically Signed   By: Norman Gatlin M.D.   On: 04/19/2024 20:16   DG Chest 2 View Result Date: 04/19/2024 CLINICAL DATA:  sob EXAM: CHEST - 2 VIEW COMPARISON:  None available. FINDINGS:  Subsegmental atelectasis in  the right lung base. No focal airspace consolidation, pleural effusion, or pneumothorax. No cardiomegaly.No acute fracture or destructive lesion. Multilevel thoracic osteophytosis. IMPRESSION: No acute cardiopulmonary abnormality. Electronically Signed   By: Rogelia Myers M.D.   On: 04/19/2024 17:10    Microbiology: Results for orders placed or performed during the hospital encounter of 04/19/24  Urine Culture     Status: Abnormal   Collection Time: 04/19/24  4:27 PM   Specimen: Urine, Clean Catch  Result Value Ref Range Status   Specimen Description   Final    URINE, CLEAN CATCH Performed at [facility], [address], [place], [place] [address]    Special Requests   Final    NONE Reflexed from [id number] Performed at [facility], [address], [place], [place] [address]    Culture MULTIPLE SPECIES PRESENT, SUGGEST RECOLLECTION (A)  Final   Report Status 04/20/2024 FINAL  Final  Resp panel by RT-PCR (RSV, Flu A&B, Covid) Anterior Nasal Swab     Status: None   Collection Time: 04/19/24  5:28 PM   Specimen: Anterior Nasal Swab  Result Value Ref Range Status   SARS Coronavirus 2 by RT PCR NEGATIVE NEGATIVE Final    Comment: (NOTE) SARS-CoV-2 target nucleic acids are NOT DETECTED.  The SARS-CoV-2 RNA is generally detectable in upper respiratory specimens during the acute phase of infection. The lowest concentration of SARS-CoV-2 viral copies this assay can detect is 138 copies/mL. A negative result does not preclude SARS-Cov-2 infection and should not be used as the sole basis for treatment or other patient management decisions. A negative result may occur with  improper specimen collection/handling, submission of specimen other than nasopharyngeal swab, presence of viral mutation(s) within the areas targeted by this assay, and inadequate number of viral copies(<138 copies/mL). A negative result must be combined with clinical  observations, patient history, and epidemiological information. The expected result is Negative.  Fact Sheet for Patients:  BloggerCourse.com  Fact Sheet for Healthcare Providers:  SeriousBroker.it  This test is no t yet approved or cleared by the [place]  FDA and  has been authorized for detection and/or diagnosis of SARS-CoV-2 by FDA under an Emergency Use Authorization (EUA). This EUA will remain  in effect (meaning this test can be used) for the duration of the COVID-19 declaration under Section 564(b)(1) of the Act, 21 U.S.C.section 360bbb-3(b)(1), unless the authorization is terminated  or revoked sooner.       Influenza A by PCR NEGATIVE NEGATIVE Final   Influenza B by PCR NEGATIVE NEGATIVE Final    Comment: (NOTE) The Xpert Xpress SARS-CoV-2/FLU/RSV plus assay is intended as an aid in the diagnosis of influenza from Nasopharyngeal swab specimens and should not be used as a sole basis for treatment. Nasal washings and aspirates are unacceptable for Xpert Xpress SARS-CoV-2/FLU/RSV testing.  Fact Sheet for Patients: BloggerCourse.com  Fact Sheet for Healthcare Providers: SeriousBroker.it  This test is not yet approved or cleared by the [place]  FDA and has been authorized for detection and/or diagnosis of SARS-CoV-2 by FDA under an Emergency Use Authorization (EUA). This EUA will remain in effect (meaning this test can be used) for the duration of the COVID-19 declaration under Section 564(b)(1) of the Act, 21 U.S.C. section 360bbb-3(b)(1), unless the authorization is terminated or revoked.     Resp Syncytial Virus by PCR NEGATIVE NEGATIVE Final    Comment: (NOTE) Fact Sheet for Patients: BloggerCourse.com  Fact Sheet for Healthcare Providers: SeriousBroker.it  This test  is not yet approved or cleared by  the [place]  FDA and has been authorized for detection and/or diagnosis of SARS-CoV-2 by FDA under an Emergency Use Authorization (EUA). This EUA will remain in effect (meaning this test can be used) for the duration of the COVID-19 declaration under Section 564(b)(1) of the Act, 21 U.S.C. section 360bbb-3(b)(1), unless the authorization is terminated or revoked.  Performed at [facility], [address] [place], [place] [address]   Blood culture (routine x 2)     Status: Abnormal   Collection Time: 04/19/24  9:08 PM   Specimen: BLOOD  Result Value Ref Range Status   Specimen Description   Final    BLOOD BLOOD LEFT FOREARM Performed at [facility], [address] [place], [place] [address]    Special Requests   Final    BOTTLES DRAWN AEROBIC AND ANAEROBIC Blood Culture results may not be optimal due to an inadequate volume of blood received in culture bottles Performed at [facility], [address], [place], [place] [address]    Culture  Setup Time   Final    GRAM NEGATIVE RODS AEROBIC BOTTLE ONLY CRITICAL VALUE NOTED.  VALUE IS CONSISTENT WITH PREVIOUSLY REPORTED AND CALLED VALUE.    Culture (A)  Final    ESCHERICHIA COLI SUSCEPTIBILITIES PERFORMED ON PREVIOUS CULTURE WITHIN THE LAST 5 DAYS. Performed at [facility], [address] [place], [place] [address]    Report Status 04/22/2024 FINAL  Final  Blood culture (routine x 2)     Status: Abnormal   Collection Time: 04/19/24  9:09 PM   Specimen: BLOOD  Result Value Ref Range Status   Specimen Description   Final    BLOOD BLOOD RIGHT FOREARM Performed at [facility], [address], [place], [place] [address]    Special Requests   Final    BOTTLES DRAWN AEROBIC AND ANAEROBIC Blood Culture results may not be optimal due to an inadequate volume of blood received in culture bottles Performed at [facility], [address], [place], [place] [address]    Culture  Setup Time   Final    GRAM NEGATIVE RODS ANAEROBIC BOTTLE ONLY CRITICAL RESULT CALLED TO, READ BACK BY AND VERIFIED WITH: PHARMD MICHELLE BELL ON 04/20/24 @ 2008 BY DRT Performed at [facility], [address] [place], [place] [address]    Culture ESCHERICHIA COLI (A)  Final   Report Status 04/22/2024 FINAL  Final   Organism ID, Bacteria ESCHERICHIA COLI  Final   Organism ID, Bacteria ESCHERICHIA COLI  Final      Susceptibility   Escherichia coli - KIRBY BAUER*    CEFAZOLIN INTERMEDIATE Intermediate    Escherichia coli - MIC*    AMPICILLIN >=32 RESISTANT Resistant     CEFEPIME <=0.12 SENSITIVE Sensitive     CEFTAZIDIME <=1 SENSITIVE Sensitive     CEFTRIAXONE  <=0.25 SENSITIVE Sensitive     CIPROFLOXACIN <=0.25 SENSITIVE Sensitive     GENTAMICIN <=1 SENSITIVE Sensitive     IMIPENEM 0.5 SENSITIVE Sensitive     TRIMETH /SULFA  <=20 SENSITIVE Sensitive     AMPICILLIN/SULBACTAM 16 INTERMEDIATE Intermediate     PIP/TAZO <=4 SENSITIVE Sensitive ug/mL    * ESCHERICHIA COLI    ESCHERICHIA COLI  Blood Culture ID Panel (Reflexed)     Status: Abnormal   Collection Time: 04/19/24  9:09 PM  Result Value Ref Range Status   Enterococcus faecalis NOT  DETECTED NOT DETECTED Final   Enterococcus Faecium NOT DETECTED NOT DETECTED Final   Listeria monocytogenes NOT DETECTED NOT DETECTED Final   Staphylococcus species NOT DETECTED NOT DETECTED Final   Staphylococcus aureus (BCID) NOT DETECTED NOT DETECTED Final   Staphylococcus epidermidis NOT DETECTED NOT DETECTED Final   Staphylococcus lugdunensis NOT DETECTED NOT DETECTED Final   Streptococcus species NOT DETECTED NOT DETECTED Final   Streptococcus agalactiae NOT DETECTED NOT DETECTED Final   Streptococcus pneumoniae NOT DETECTED NOT DETECTED Final   Streptococcus pyogenes NOT DETECTED NOT DETECTED Final   A.calcoaceticus-baumannii NOT DETECTED NOT DETECTED Final   Bacteroides fragilis NOT  DETECTED NOT DETECTED Final   Enterobacterales DETECTED (A) NOT DETECTED Final    Comment: Enterobacterales represent a large order of gram negative bacteria, not a single organism. CRITICAL RESULT CALLED TO, READ BACK BY AND VERIFIED WITH: PHARMD MICHELLE BELL ON 04/20/24 @ 2008 BY DRT    Enterobacter cloacae complex NOT DETECTED NOT DETECTED Final   Escherichia coli DETECTED (A) NOT DETECTED Final    Comment: CRITICAL RESULT CALLED TO, READ BACK BY AND VERIFIED WITH: PHARMD MICHELLE BELL ON 04/20/24 @ 2008 BY DRT    Klebsiella aerogenes NOT DETECTED NOT DETECTED Final   Klebsiella oxytoca NOT DETECTED NOT DETECTED Final   Klebsiella pneumoniae NOT DETECTED NOT DETECTED Final   Proteus species NOT DETECTED NOT DETECTED Final   Salmonella species NOT DETECTED NOT DETECTED Final   Serratia marcescens NOT DETECTED NOT DETECTED Final   Haemophilus influenzae NOT DETECTED NOT DETECTED Final   Neisseria meningitidis NOT DETECTED NOT DETECTED Final   Pseudomonas aeruginosa NOT DETECTED NOT DETECTED Final   Stenotrophomonas maltophilia NOT DETECTED NOT DETECTED Final   Candida albicans NOT DETECTED NOT DETECTED Final   Candida auris NOT DETECTED NOT DETECTED Final   Candida glabrata NOT DETECTED NOT DETECTED Final   Candida krusei NOT DETECTED NOT DETECTED Final   Candida parapsilosis NOT DETECTED NOT DETECTED Final   Candida tropicalis NOT DETECTED NOT DETECTED Final   Cryptococcus neoformans/gattii NOT DETECTED NOT DETECTED Final   CTX-M ESBL NOT DETECTED NOT DETECTED Final   Carbapenem resistance IMP NOT DETECTED NOT DETECTED Final   Carbapenem resistance KPC NOT DETECTED NOT DETECTED Final   Carbapenem resistance NDM NOT DETECTED NOT DETECTED Final   Carbapenem resist OXA 48 LIKE NOT DETECTED NOT DETECTED Final   Carbapenem resistance VIM NOT DETECTED NOT DETECTED Final    Comment: Performed at [facility], [address] [place], [place] [address]    Labs: CBC: Recent Labs   Lab 04/19/24 1727 04/21/24 0548  WBC 11.5* 7.5  NEUTROABS 9.8* 5.6  HGB 12.3 9.9*  HCT 38.9 30.9*  MCV 75.1* 75.6*  PLT 407* 249   Basic Metabolic Panel: Recent Labs  Lab 04/19/24 2146 04/19/24 2147 04/20/24 0118 04/20/24 0654 04/21/24 0548 04/22/24 0559  NA 127*  --  133* 137 133* 136  K 4.4  --  3.9 4.0 3.7 4.7  CL 95*  --  100 104 104 103  CO2 17*  --  20* 21* 21* 18*  GLUCOSE 471*  --  195* 162* 236* 209*  BUN 59*  --  52* 46* 22 19  CREATININE 1.64*  --  1.29* 0.98 1.06* 1.04*  CALCIUM  8.9  --  9.8 9.9 8.9 9.0  MG  --  2.6* 2.7* 2.6*  --   --    Liver Function Tests: Recent Labs  Lab 04/19/24 1727  AST 18  ALT 26  ALKPHOS 135*  BILITOT 1.3*  PROT 10.0*  ALBUMIN 3.8   CBG: Recent Labs  Lab 04/21/24 0744 04/21/24 1104 04/21/24 1702 04/21/24 2121 04/22/24 0722  GLUCAP 224* 171* 218* 255* 202*    Discharge time spent: greater than 30 minutes.  Signed: Delon Herald, MD [facility] 04/22/2024

## 2024-04-22 NOTE — Progress Notes (Signed)
 Physician Discharge Summary  Patient ID: Whitney Mitchell MRN: [id number] DOB/AGE: 1961/03/01 63 y.o.  Admit date: 04/19/2024 Discharge date: 04/22/2024  Admission Diagnoses:  Discharge Diagnoses:  Principal Problem:   DKA (diabetic ketoacidosis) (HCC) Active Problems:   Uncontrolled type 2 diabetes mellitus with hyperglycemia (HCC)   Sepsis secondary to UTI (HCC)   Acute urinary retention   Bilateral hydronephrosis   AKI (acute kidney injury) (HCC)   Weight loss, unintentional   Discharged Condition: good   Discharge Exam: Blood pressure (!) 129/94, pulse 80, temperature 98.6 F (37 C), temperature source Oral, resp. rate (!) 22, height 5' 3 (1.6 m), weight 59.8 kg, SpO2 100%.  Disposition: Discharge disposition: 01-Home or Self Care       Discharge Instructions     Call MD for:  difficulty breathing, headache or visual disturbances   Complete by: As directed    Call MD for:  extreme fatigue   Complete by: As directed    Call MD for:  persistant dizziness or light-headedness   Complete by: As directed    Call MD for:  persistant nausea and vomiting   Complete by: As directed    Call MD for:  severe uncontrolled pain   Complete by: As directed    Call MD for:  temperature >100.4   Complete by: As directed    Diet Carb Modified   Complete by: As directed    Discharge instructions   Complete by: As directed    1. You are being discharged home with physical therapy 2. Take antibiotics until gone (Bactrim  twice daily through 7/7) 3. Stop taking Jardiance 4. Continue Lantus  but increase to 10 units daily and add short-acting mealtime insulin  (Novolog  3 units with meals) 5. Take sodium bicarbonate  twice daily for 3 days 6. Do in and out caths 2-4 times every single day 7. Follow up next week with Dr. Benjamine; will need to ensure clearance of bacteremia on cultures (currently pending) 8. Follow up with Dr. Elisabeth from urology; call for an appointment   Foley catheter -  discontinue   Complete by: As directed    Increase activity slowly   Complete by: As directed       Allergies as of 04/22/2024       Reactions   Pollen Extract Itching, Other (See Comments)   Itchy eyes, runny nose, sneezing = Seasonal allergies        Medication List     STOP taking these medications    empagliflozin 25 MG Tabs tablet Commonly known as: JARDIANCE   irbesartan-hydrochlorothiazide 150-12.5 MG tablet Commonly known as: AVALIDE       TAKE these medications    feeding supplement Liqd Take 237 mLs by mouth 2 (two) times daily between meals.   insulin  aspart 100 UNIT/ML injection Commonly known as: novoLOG  Inject 3 Units into the skin 3 (three) times daily with meals.   Lantus  100 UNIT/ML injection Generic drug: insulin  glargine Inject 0.1 mLs (10 Units total) into the skin in the morning. What changed: how much to take   rosuvastatin  20 MG tablet Commonly known as: CRESTOR  Take 20 mg by mouth daily.   sodium bicarbonate  650 MG tablet Take 1 tablet (650 mg total) by mouth 2 (two) times daily for 3 days.   sulfamethoxazole -trimethoprim  800-160 MG tablet Commonly known as: Bactrim  DS Take 1 tablet by mouth 2 (two) times daily for 4 days.   TYLENOL  500 MG tablet Generic drug: acetaminophen  Take 500-1,000 mg by mouth  every 6 (six) hours as needed (for headaches).         Signed: Deleta CHRISTELLA Savers 04/22/2024, 12:46 PM

## 2024-04-27 LAB — CULTURE, BLOOD (ROUTINE X 2)
Culture: NO GROWTH
Culture: NO GROWTH
Special Requests: ADEQUATE
Special Requests: ADEQUATE
